# Patient Record
Sex: Female | Born: 1935 | Race: White | Hispanic: No | Marital: Married | Smoking: Never smoker
Health system: Southern US, Community
[De-identification: ages and names within clinical notes are randomized; demographics above are authoritative.]

## PROBLEM LIST (undated history)

## (undated) ENCOUNTER — Emergency Department (HOSPITAL_COMMUNITY): Payer: Self-pay | Source: Home / Self Care

## (undated) DIAGNOSIS — I959 Hypotension, unspecified: Secondary | ICD-10-CM

## (undated) DIAGNOSIS — E7800 Pure hypercholesterolemia, unspecified: Secondary | ICD-10-CM

## (undated) DIAGNOSIS — C801 Malignant (primary) neoplasm, unspecified: Secondary | ICD-10-CM

## (undated) DIAGNOSIS — I4891 Unspecified atrial fibrillation: Secondary | ICD-10-CM

## (undated) DIAGNOSIS — Z950 Presence of cardiac pacemaker: Secondary | ICD-10-CM

## (undated) HISTORY — PX: KNEE SURGERY: SHX244

## (undated) HISTORY — PX: CHOLECYSTECTOMY: SHX55

## (undated) HISTORY — PX: ANKLE SURGERY: SHX546

## (undated) HISTORY — PX: BREAST SURGERY: SHX581

---

## 2003-12-19 ENCOUNTER — Encounter: Payer: Self-pay | Admitting: Internal Medicine

## 2003-12-19 LAB — CONVERTED CEMR LAB

## 2004-05-13 ENCOUNTER — Ambulatory Visit: Admission: RE | Admit: 2004-05-13 | Discharge: 2004-07-20 | Payer: Self-pay | Admitting: Radiation Oncology

## 2004-08-23 ENCOUNTER — Ambulatory Visit: Admission: RE | Admit: 2004-08-23 | Discharge: 2004-08-23 | Payer: Self-pay

## 2004-10-21 ENCOUNTER — Ambulatory Visit: Payer: Self-pay | Admitting: Oncology

## 2005-01-18 ENCOUNTER — Ambulatory Visit: Payer: Self-pay | Admitting: Oncology

## 2005-01-27 ENCOUNTER — Ambulatory Visit: Admission: RE | Admit: 2005-01-27 | Discharge: 2005-01-27 | Payer: Self-pay | Admitting: Radiation Oncology

## 2005-04-12 ENCOUNTER — Ambulatory Visit: Payer: Self-pay | Admitting: Oncology

## 2005-07-05 ENCOUNTER — Ambulatory Visit: Payer: Self-pay | Admitting: Oncology

## 2005-09-08 ENCOUNTER — Ambulatory Visit: Payer: Self-pay | Admitting: Oncology

## 2005-12-15 ENCOUNTER — Ambulatory Visit: Payer: Self-pay | Admitting: Oncology

## 2005-12-22 ENCOUNTER — Ambulatory Visit: Payer: Self-pay | Admitting: Internal Medicine

## 2006-01-09 ENCOUNTER — Ambulatory Visit: Payer: Self-pay | Admitting: Internal Medicine

## 2006-02-09 ENCOUNTER — Ambulatory Visit: Payer: Self-pay | Admitting: Oncology

## 2006-04-13 ENCOUNTER — Ambulatory Visit: Payer: Self-pay | Admitting: Oncology

## 2006-05-03 ENCOUNTER — Ambulatory Visit: Payer: Self-pay | Admitting: Endocrinology

## 2006-06-08 ENCOUNTER — Ambulatory Visit: Payer: Self-pay | Admitting: Oncology

## 2006-08-03 ENCOUNTER — Ambulatory Visit: Payer: Self-pay | Admitting: Oncology

## 2006-11-26 ENCOUNTER — Ambulatory Visit: Payer: Self-pay | Admitting: Oncology

## 2006-12-04 ENCOUNTER — Ambulatory Visit: Payer: Self-pay | Admitting: Internal Medicine

## 2007-01-07 ENCOUNTER — Ambulatory Visit: Payer: Self-pay | Admitting: Internal Medicine

## 2007-01-16 ENCOUNTER — Ambulatory Visit: Payer: Self-pay | Admitting: Internal Medicine

## 2007-02-12 ENCOUNTER — Ambulatory Visit: Payer: Self-pay | Admitting: Internal Medicine

## 2007-03-15 ENCOUNTER — Ambulatory Visit: Payer: Self-pay | Admitting: Internal Medicine

## 2007-03-15 LAB — CONVERTED CEMR LAB
Basophils Relative: 1.7 % — ABNORMAL HIGH (ref 0.0–1.0)
Bilirubin, Direct: 0.2 mg/dL (ref 0.0–0.3)
CO2: 30 meq/L (ref 19–32)
Creatinine, Ser: 0.9 mg/dL (ref 0.4–1.2)
Eosinophils Relative: 4.7 % (ref 0.0–5.0)
GFR calc Af Amer: 78 mL/min
Glucose, Bld: 94 mg/dL (ref 70–99)
HCT: 38.7 % (ref 36.0–46.0)
HDL: 59.3 mg/dL (ref >39.0)
Hemoglobin: 13.6 g/dL (ref 12.0–15.0)
Lymphocytes Relative: 17.3 % (ref 12.0–46.0)
Monocytes Absolute: 0.4 10*3/uL (ref 0.2–0.7)
Monocytes Relative: 8.4 % (ref 3.0–11.0)
Neutro Abs: 3.2 10*3/uL (ref 1.4–7.7)
Neutrophils Relative %: 67.9 % (ref 43.0–77.0)
Potassium: 4.4 meq/L (ref 3.5–5.1)
Sodium: 143 meq/L (ref 135–145)
Total Bilirubin: 0.8 mg/dL (ref 0.3–1.2)
Total Protein: 6.7 g/dL (ref 6.0–8.3)
VLDL: 21 mg/dL (ref 0–40)
WBC: 4.7 10*3/uL (ref 4.5–10.5)

## 2007-05-29 ENCOUNTER — Ambulatory Visit: Payer: Self-pay | Admitting: Oncology

## 2007-08-06 ENCOUNTER — Encounter: Payer: Self-pay | Admitting: Internal Medicine

## 2007-08-06 DIAGNOSIS — J45909 Unspecified asthma, uncomplicated: Secondary | ICD-10-CM | POA: Insufficient documentation

## 2007-08-06 DIAGNOSIS — Z853 Personal history of malignant neoplasm of breast: Secondary | ICD-10-CM

## 2007-08-12 DIAGNOSIS — J309 Allergic rhinitis, unspecified: Secondary | ICD-10-CM | POA: Insufficient documentation

## 2007-08-12 DIAGNOSIS — Z860100 Personal history of colon polyps, unspecified: Secondary | ICD-10-CM | POA: Insufficient documentation

## 2007-08-12 DIAGNOSIS — M1990 Unspecified osteoarthritis, unspecified site: Secondary | ICD-10-CM | POA: Insufficient documentation

## 2007-08-12 DIAGNOSIS — E119 Type 2 diabetes mellitus without complications: Secondary | ICD-10-CM

## 2007-11-25 ENCOUNTER — Encounter: Payer: Self-pay | Admitting: Internal Medicine

## 2007-12-08 ENCOUNTER — Telehealth: Payer: Self-pay | Admitting: Internal Medicine

## 2007-12-27 ENCOUNTER — Encounter: Payer: Self-pay | Admitting: Internal Medicine

## 2007-12-31 ENCOUNTER — Ambulatory Visit: Payer: Self-pay | Admitting: Internal Medicine

## 2007-12-31 DIAGNOSIS — R5383 Other fatigue: Secondary | ICD-10-CM

## 2007-12-31 DIAGNOSIS — R030 Elevated blood-pressure reading, without diagnosis of hypertension: Secondary | ICD-10-CM

## 2007-12-31 DIAGNOSIS — M810 Age-related osteoporosis without current pathological fracture: Secondary | ICD-10-CM | POA: Insufficient documentation

## 2007-12-31 DIAGNOSIS — R5381 Other malaise: Secondary | ICD-10-CM

## 2007-12-31 DIAGNOSIS — E785 Hyperlipidemia, unspecified: Secondary | ICD-10-CM

## 2008-01-01 LAB — CONVERTED CEMR LAB
AST: 25 units/L (ref 0–37)
Albumin: 3.8 g/dL (ref 3.5–5.2)
Basophils Absolute: 0.1 10*3/uL (ref 0.0–0.1)
Bilirubin, Direct: 0.1 mg/dL (ref 0.0–0.3)
Chloride: 104 meq/L (ref 96–112)
Cholesterol: 135 mg/dL (ref 0–200)
Creatinine, Ser: 0.9 mg/dL (ref 0.4–1.2)
Creatinine,U: 22.4 mg/dL
Eosinophils Relative: 4.1 % (ref 0.0–5.0)
Glucose, Bld: 85 mg/dL (ref 70–99)
HCT: 38.8 % (ref 36.0–46.0)
Hemoglobin: 13.5 g/dL (ref 12.0–15.0)
LDL Cholesterol: 60 mg/dL (ref 0–99)
MCHC: 34.8 g/dL (ref 30.0–36.0)
MCV: 87 fL (ref 78.0–100.0)
Microalb, Ur: 0.6 mg/dL (ref 0.0–1.9)
Monocytes Absolute: 0.5 10*3/uL (ref 0.2–0.7)
Neutrophils Relative %: 63.4 % (ref 43.0–77.0)
Potassium: 4.3 meq/L (ref 3.5–5.1)
RBC: 4.46 M/uL (ref 3.87–5.11)
RDW: 12.9 % (ref 11.5–14.6)
Sodium: 139 meq/L (ref 135–145)
Total Bilirubin: 0.9 mg/dL (ref 0.3–1.2)
Total Protein: 7 g/dL (ref 6.0–8.3)
WBC: 4.8 10*3/uL (ref 4.5–10.5)

## 2008-01-03 ENCOUNTER — Encounter: Payer: Self-pay | Admitting: Internal Medicine

## 2008-04-21 ENCOUNTER — Ambulatory Visit: Payer: Self-pay | Admitting: Internal Medicine

## 2008-04-21 DIAGNOSIS — L989 Disorder of the skin and subcutaneous tissue, unspecified: Secondary | ICD-10-CM | POA: Insufficient documentation

## 2008-06-17 ENCOUNTER — Encounter: Payer: Self-pay | Admitting: Internal Medicine

## 2008-06-18 ENCOUNTER — Telehealth (INDEPENDENT_AMBULATORY_CARE_PROVIDER_SITE_OTHER): Payer: Self-pay | Admitting: *Deleted

## 2008-07-08 ENCOUNTER — Encounter: Payer: Self-pay | Admitting: Internal Medicine

## 2008-08-11 ENCOUNTER — Encounter: Payer: Self-pay | Admitting: Internal Medicine

## 2008-08-13 ENCOUNTER — Ambulatory Visit (HOSPITAL_BASED_OUTPATIENT_CLINIC_OR_DEPARTMENT_OTHER): Admission: RE | Admit: 2008-08-13 | Discharge: 2008-08-13 | Payer: Self-pay | Admitting: Orthopedic Surgery

## 2008-08-13 ENCOUNTER — Encounter (INDEPENDENT_AMBULATORY_CARE_PROVIDER_SITE_OTHER): Payer: Self-pay | Admitting: Orthopedic Surgery

## 2008-08-18 ENCOUNTER — Encounter: Payer: Self-pay | Admitting: Internal Medicine

## 2008-08-27 ENCOUNTER — Encounter: Payer: Self-pay | Admitting: Internal Medicine

## 2008-09-02 ENCOUNTER — Encounter: Payer: Self-pay | Admitting: Internal Medicine

## 2008-10-22 ENCOUNTER — Encounter: Payer: Self-pay | Admitting: Internal Medicine

## 2008-10-29 ENCOUNTER — Encounter: Payer: Self-pay | Admitting: Internal Medicine

## 2009-07-19 ENCOUNTER — Telehealth: Payer: Self-pay | Admitting: Internal Medicine

## 2011-05-02 NOTE — Op Note (Signed)
NAME:  [name], Susan                  ACCOUNT NO.:  [id number]   MEDICAL RECORD NO.:  [id number]          PATIENT TYPE:  AMB   LOCATION:  DSC                          FACILITY:  [facility]   PHYSICIAN:  Cindee Salt, M.D.       DATE OF BIRTH:  1929/11/08   DATE OF PROCEDURE:  DATE OF DISCHARGE:                               OPERATIVE REPORT   PREOPERATIVE DIAGNOSIS:  Mucoid cyst, left index, left middle finger.   POSTOPERATIVE DIAGNOSIS:  Mucoid cyst left index, left middle finger.   OPERATION:  Excision of cyst, debridement distal interphalangeal joint  left index, left middle finger.   SURGEON:  Cindee Salt, MD   ASSISTANT:  Carolyne Fiscal, RN   ANESTHESIA:  General.   ANESTHESIOLOGIST:  Zenon Mayo, MD   HISTORY:  The patient is a 77 year old female with a history of mucoid  cyst, degenerative arthritis DIP joints, left index, left middle finger.  She has drained the index finger on several occasions.  She is admitted  now for excision.   Pre, peri, and postoperative course have been discussed along with risks  and complications.  She is aware that there is no guarantee with the  surgery, possibility of infection, recurrence, injury to arteries,  nerves, or tendons, complete relief of symptoms, dystrophy, possibility  of stiffness of the joints, possibility of future fusion.  She has  elected to proceed to have this done.  In the preoperative area, the  patient was seen, the extremity marked by both the patient and surgeon,  questions again encouraged, and answered to her satisfaction.  Antibiotic given.   PROCEDURE:  The patient is brought to the operating room.  A general  anesthetic carried out under the direction of Dr. Sampson Goon.  She is  prepped using DuraPrep, supine position, left arm free.  Penrose drains  were used for tourniquet control at base of the finger.  The middle  finger was approached first.  The finger was exsanguinated with gauze  sponge.  Penrose drain was  used for tourniquet control.  At the base of  the finger, a curvilinear incision was made, carried down through  subcutaneous tissue.  Bleeders were electrocauterized.  The extensor  tendon was identified.  The cyst on either side identified.  These were  excised, sent to pathology.  The joint was then opened, debrided with a  Fackler rongeur including a synovectomy of a very significant synovitis.  No further lesions were identified.  Specimen was sent to Pathology.  The wound irrigated and skin closed with interrupted 5-0 Vicryl deep  sutures.  Separate incision was then made over the middle finger, distal  interphalangeal joint and carried down through subcutaneous tissue.  Bleeders again electrocauterized.  The cysts were followed under the  skin and excised both radially and ulnarly.  The joint was opened and a  synovectomy and debridement of the joint of osteophytes was then  performed using a Sitzman rongeur.  Again specimen was sent to Pathology  separately.  Tourniquet control was again done under Penrose drain  tourniquet at the base  of the finger.  The wound irrigated and closed  with interrupted  5-0 Vicryl Rapide sutures.  A sterile compressive dressing splint to  each distal interphalangeal joint was applied.  The patient tolerated  the procedure well, was taken to the recovery room for observation in  satisfactory condition.  She will be discharged home to return to the  [facility] in [place] in 1 week on Vicodin.           ______________________________  Cindee Salt, M.D.     GK/MEDQ  D:  08/13/2008  T:  08/14/2008  Job:  [id number]

## 2014-01-18 DEATH — deceased

## 2014-06-17 HISTORY — PX: PACEMAKER INSERTION: SHX728

## 2014-08-26 ENCOUNTER — Inpatient Hospital Stay (HOSPITAL_COMMUNITY): Payer: Medicare Other

## 2014-08-26 ENCOUNTER — Emergency Department (HOSPITAL_COMMUNITY): Payer: Medicare Other | Admitting: Anesthesiology

## 2014-08-26 ENCOUNTER — Emergency Department (HOSPITAL_COMMUNITY): Payer: Medicare Other

## 2014-08-26 ENCOUNTER — Encounter (HOSPITAL_COMMUNITY): Payer: Self-pay | Admitting: Emergency Medicine

## 2014-08-26 ENCOUNTER — Inpatient Hospital Stay (HOSPITAL_COMMUNITY)
Admission: EM | Admit: 2014-08-26 | Discharge: 2014-09-01 | DRG: 023 | Disposition: A | Discharge status: Home Health | Payer: Medicare Other | Source: Intra-hospital | Attending: Neurology | Admitting: Neurology

## 2014-08-26 ENCOUNTER — Encounter (HOSPITAL_COMMUNITY): Admission: EM | Disposition: A | Discharge status: Home Health | Payer: Self-pay | Attending: Neurology

## 2014-08-26 ENCOUNTER — Encounter (HOSPITAL_COMMUNITY): Payer: Medicare Other | Admitting: Anesthesiology

## 2014-08-26 DIAGNOSIS — R471 Dysarthria and anarthria: Secondary | ICD-10-CM | POA: Diagnosis present

## 2014-08-26 DIAGNOSIS — G8190 Hemiplegia, unspecified affecting unspecified side: Secondary | ICD-10-CM | POA: Diagnosis present

## 2014-08-26 DIAGNOSIS — I10 Essential (primary) hypertension: Secondary | ICD-10-CM | POA: Diagnosis present

## 2014-08-26 DIAGNOSIS — G255 Other chorea: Secondary | ICD-10-CM

## 2014-08-26 DIAGNOSIS — I4891 Unspecified atrial fibrillation: Secondary | ICD-10-CM | POA: Diagnosis present

## 2014-08-26 DIAGNOSIS — R29810 Facial weakness: Secondary | ICD-10-CM | POA: Diagnosis present

## 2014-08-26 DIAGNOSIS — E785 Hyperlipidemia, unspecified: Secondary | ICD-10-CM

## 2014-08-26 DIAGNOSIS — I6340 Cerebral infarction due to embolism of unspecified cerebral artery: Secondary | ICD-10-CM | POA: Diagnosis present

## 2014-08-26 DIAGNOSIS — E7800 Pure hypercholesterolemia, unspecified: Secondary | ICD-10-CM | POA: Diagnosis present

## 2014-08-26 DIAGNOSIS — D649 Anemia, unspecified: Secondary | ICD-10-CM | POA: Diagnosis present

## 2014-08-26 DIAGNOSIS — I639 Cerebral infarction, unspecified: Secondary | ICD-10-CM | POA: Diagnosis present

## 2014-08-26 DIAGNOSIS — Z888 Allergy status to other drugs, medicaments and biological substances status: Secondary | ICD-10-CM | POA: Diagnosis not present

## 2014-08-26 DIAGNOSIS — I5022 Chronic systolic (congestive) heart failure: Secondary | ICD-10-CM

## 2014-08-26 DIAGNOSIS — G2581 Restless legs syndrome: Secondary | ICD-10-CM

## 2014-08-26 DIAGNOSIS — I951 Orthostatic hypotension: Secondary | ICD-10-CM

## 2014-08-26 DIAGNOSIS — Z950 Presence of cardiac pacemaker: Secondary | ICD-10-CM | POA: Diagnosis not present

## 2014-08-26 DIAGNOSIS — G9340 Encephalopathy, unspecified: Secondary | ICD-10-CM | POA: Diagnosis present

## 2014-08-26 DIAGNOSIS — I509 Heart failure, unspecified: Secondary | ICD-10-CM | POA: Diagnosis present

## 2014-08-26 DIAGNOSIS — I63239 Cerebral infarction due to unspecified occlusion or stenosis of unspecified carotid arteries: Secondary | ICD-10-CM | POA: Diagnosis present

## 2014-08-26 DIAGNOSIS — G936 Cerebral edema: Secondary | ICD-10-CM | POA: Diagnosis present

## 2014-08-26 DIAGNOSIS — Z23 Encounter for immunization: Secondary | ICD-10-CM

## 2014-08-26 DIAGNOSIS — I447 Left bundle-branch block, unspecified: Secondary | ICD-10-CM | POA: Diagnosis not present

## 2014-08-26 DIAGNOSIS — J4489 Other specified chronic obstructive pulmonary disease: Secondary | ICD-10-CM

## 2014-08-26 DIAGNOSIS — J9600 Acute respiratory failure, unspecified whether with hypoxia or hypercapnia: Secondary | ICD-10-CM

## 2014-08-26 DIAGNOSIS — I5020 Unspecified systolic (congestive) heart failure: Secondary | ICD-10-CM

## 2014-08-26 DIAGNOSIS — E119 Type 2 diabetes mellitus without complications: Secondary | ICD-10-CM

## 2014-08-26 DIAGNOSIS — Z9911 Dependence on respirator [ventilator] status: Secondary | ICD-10-CM

## 2014-08-26 DIAGNOSIS — I63411 Cerebral infarction due to embolism of right middle cerebral artery: Secondary | ICD-10-CM

## 2014-08-26 DIAGNOSIS — I6350 Cerebral infarction due to unspecified occlusion or stenosis of unspecified cerebral artery: Secondary | ICD-10-CM

## 2014-08-26 DIAGNOSIS — J9601 Acute respiratory failure with hypoxia: Secondary | ICD-10-CM

## 2014-08-26 DIAGNOSIS — J449 Chronic obstructive pulmonary disease, unspecified: Secondary | ICD-10-CM

## 2014-08-26 HISTORY — PX: RADIOLOGY WITH ANESTHESIA: SHX6223

## 2014-08-26 HISTORY — DX: Pure hypercholesterolemia, unspecified: E78.00

## 2014-08-26 HISTORY — DX: Malignant (primary) neoplasm, unspecified: C80.1

## 2014-08-26 HISTORY — DX: Unspecified atrial fibrillation: I48.91

## 2014-08-26 HISTORY — DX: Presence of cardiac pacemaker: Z95.0

## 2014-08-26 HISTORY — DX: Hypotension, unspecified: I95.9

## 2014-08-26 LAB — URINE MICROSCOPIC-ADD ON

## 2014-08-26 LAB — CBC
HCT: 35.5 % — ABNORMAL LOW (ref 36.0–46.0)
Hemoglobin: 11.3 g/dL — ABNORMAL LOW (ref 12.0–15.0)
MCH: 28 pg (ref 26.0–34.0)
MCHC: 31.8 g/dL (ref 30.0–36.0)
MCV: 88.1 fL (ref 78.0–100.0)
Platelets: 207 10*3/uL (ref 150–400)
RBC: 4.03 MIL/uL (ref 3.87–5.11)
RDW: 14 % (ref 11.5–15.5)
WBC: 8.3 10*3/uL (ref 4.0–10.5)

## 2014-08-26 LAB — I-STAT TROPONIN, ED: TROPONIN I, POC: 0.01 ng/mL (ref 0.00–0.08)

## 2014-08-26 LAB — DIFFERENTIAL
BASOS PCT: 1 % (ref 0–1)
Basophils Absolute: 0 10*3/uL (ref 0.0–0.1)
Eosinophils Absolute: 0.2 10*3/uL (ref 0.0–0.7)
Eosinophils Relative: 2 % (ref 0–5)
Lymphocytes Relative: 12 % (ref 12–46)
Lymphs Abs: 1 10*3/uL (ref 0.7–4.0)
MONO ABS: 0.8 10*3/uL (ref 0.1–1.0)
Monocytes Relative: 9 % (ref 3–12)
NEUTROS PCT: 76 % (ref 43–77)
Neutro Abs: 6.4 10*3/uL (ref 1.7–7.7)

## 2014-08-26 LAB — COMPREHENSIVE METABOLIC PANEL
ALT: 133 U/L — AB (ref 0–35)
AST: 53 U/L — AB (ref 0–37)
Albumin: 3.3 g/dL — ABNORMAL LOW (ref 3.5–5.2)
Alkaline Phosphatase: 75 U/L (ref 39–117)
Anion gap: 12 (ref 5–15)
BILIRUBIN TOTAL: 0.3 mg/dL (ref 0.3–1.2)
BUN: 16 mg/dL (ref 6–23)
CO2: 23 mEq/L (ref 19–32)
CREATININE: 0.85 mg/dL (ref 0.50–1.10)
Calcium: 8.9 mg/dL (ref 8.4–10.5)
Chloride: 103 mEq/L (ref 96–112)
GFR calc Af Amer: 72 mL/min — ABNORMAL LOW (ref >90)
GFR calc non Af Amer: 62 mL/min — ABNORMAL LOW (ref >90)
Glucose, Bld: 90 mg/dL (ref 70–99)
Potassium: 4 mEq/L (ref 3.7–5.3)
Sodium: 138 mEq/L (ref 137–147)
Total Protein: 6.4 g/dL (ref 6.0–8.3)

## 2014-08-26 LAB — URINALYSIS, ROUTINE W REFLEX MICROSCOPIC
BILIRUBIN URINE: NEGATIVE
GLUCOSE, UA: NEGATIVE mg/dL
KETONES UR: NEGATIVE mg/dL
Leukocytes, UA: NEGATIVE
Nitrite: NEGATIVE
Protein, ur: 30 mg/dL — AB
Specific Gravity, Urine: 1.024 (ref 1.005–1.030)
Urobilinogen, UA: 1 mg/dL (ref 0.0–1.0)
pH: 5 (ref 5.0–8.0)

## 2014-08-26 LAB — POCT I-STAT 4, (NA,K, GLUC, HGB,HCT)
GLUCOSE: 111 mg/dL — AB (ref 70–99)
HCT: 27 % — ABNORMAL LOW (ref 36.0–46.0)
Hemoglobin: 9.2 g/dL — ABNORMAL LOW (ref 12.0–15.0)
Potassium: 3.7 mEq/L (ref 3.7–5.3)
Sodium: 140 mEq/L (ref 137–147)

## 2014-08-26 LAB — RAPID URINE DRUG SCREEN, HOSP PERFORMED
Amphetamines: NOT DETECTED
BARBITURATES: NOT DETECTED
BENZODIAZEPINES: NOT DETECTED
COCAINE: NOT DETECTED
Opiates: NOT DETECTED
Tetrahydrocannabinol: NOT DETECTED

## 2014-08-26 LAB — PROTIME-INR
INR: 1.13 (ref 0.00–1.49)
Prothrombin Time: 14.5 seconds (ref 11.6–15.2)

## 2014-08-26 LAB — TRIGLYCERIDES: Triglycerides: 100 mg/dL (ref <150)

## 2014-08-26 LAB — TROPONIN I

## 2014-08-26 LAB — APTT: aPTT: 25 seconds (ref 24–37)

## 2014-08-26 LAB — CBG MONITORING, ED: Glucose-Capillary: 81 mg/dL (ref 70–99)

## 2014-08-26 SURGERY — RADIOLOGY WITH ANESTHESIA
Anesthesia: General

## 2014-08-26 MED ORDER — CEFAZOLIN SODIUM-DEXTROSE 2-3 GM-% IV SOLR
INTRAVENOUS | Status: AC
Start: 2014-08-26 — End: 2014-08-27
  Filled 2014-08-26: qty 50

## 2014-08-26 MED ORDER — FENTANYL CITRATE 0.05 MG/ML IJ SOLN
INTRAMUSCULAR | Status: DC | PRN
  Administered 2014-08-26 (×5): 50 ug via INTRAVENOUS

## 2014-08-26 MED ORDER — FENTANYL CITRATE 0.05 MG/ML IJ SOLN
50.0000 ug | INTRAMUSCULAR | Status: DC | PRN
  Administered 2014-08-26 – 2014-08-27 (×2): 50 ug via INTRAVENOUS
  Filled 2014-08-26 (×2): qty 2

## 2014-08-26 MED ORDER — PROPOFOL 10 MG/ML IV BOLUS
INTRAVENOUS | Status: DC | PRN
  Administered 2014-08-26: 40 mg via INTRAVENOUS
  Administered 2014-08-26: 100 mg via INTRAVENOUS

## 2014-08-26 MED ORDER — SODIUM CHLORIDE 0.9 % IV SOLN
INTRAVENOUS | Status: DC | PRN
  Administered 2014-08-26 (×2): via INTRAVENOUS

## 2014-08-26 MED ORDER — PANTOPRAZOLE SODIUM 40 MG IV SOLR
40.0000 mg | Freq: Every day | INTRAVENOUS | Status: DC
  Administered 2014-08-26 – 2014-08-27 (×2): 40 mg via INTRAVENOUS
  Filled 2014-08-26 (×2): qty 40

## 2014-08-26 MED ORDER — EPTIFIBATIDE 2 MG/ML IV SOLN
INTRAVENOUS | Status: AC
  Administered 2014-08-26 (×4): 2 mg
  Administered 2014-08-26: 1.8 mg
  Filled 2014-08-26: qty 10

## 2014-08-26 MED ORDER — LABETALOL HCL 5 MG/ML IV SOLN
INTRAVENOUS | Status: AC
  Administered 2014-08-26: 20 mg
  Filled 2014-08-26: qty 4

## 2014-08-26 MED ORDER — ACETAMINOPHEN 650 MG RE SUPP: 650.0000 mg | RECTAL | Status: DC | PRN

## 2014-08-26 MED ORDER — ACETAMINOPHEN 325 MG PO TABS: 650.0000 mg | ORAL_TABLET | ORAL | Status: DC | PRN

## 2014-08-26 MED ORDER — SUCCINYLCHOLINE CHLORIDE 20 MG/ML IJ SOLN
INTRAMUSCULAR | Status: DC | PRN
  Administered 2014-08-26: 80 mg via INTRAVENOUS

## 2014-08-26 MED ORDER — STROKE: EARLY STAGES OF RECOVERY BOOK
Freq: Once | Status: AC
  Administered 2014-08-26: 20:00:00
  Filled 2014-08-26: qty 1

## 2014-08-26 MED ORDER — PROPOFOL 10 MG/ML IV EMUL
0.0000 ug/kg/min | INTRAVENOUS | Status: DC
  Administered 2014-08-26: 5 ug/kg/min via INTRAVENOUS
  Administered 2014-08-27: 15 ug/kg/min via INTRAVENOUS
  Administered 2014-08-27: 30 ug/kg/min via INTRAVENOUS
  Filled 2014-08-26: qty 200
  Filled 2014-08-26 (×2): qty 100

## 2014-08-26 MED ORDER — SODIUM CHLORIDE 0.9 % IV SOLN: INTRAVENOUS | Status: DC

## 2014-08-26 MED ORDER — SENNOSIDES-DOCUSATE SODIUM 8.6-50 MG PO TABS
1.0000 | ORAL_TABLET | Freq: Every evening | ORAL | Status: DC | PRN
Start: 2014-08-26 — End: 2014-09-01
  Filled 2014-08-26: qty 1

## 2014-08-26 MED ORDER — NITROGLYCERIN 1 MG/10 ML FOR IR/CATH LAB
INTRA_ARTERIAL | Status: AC
  Administered 2014-08-26 (×2): 25 ug
  Filled 2014-08-26: qty 10

## 2014-08-26 MED ORDER — PANTOPRAZOLE SODIUM 40 MG IV SOLR: 40.0000 mg | Freq: Every day | INTRAVENOUS | Status: DC

## 2014-08-26 MED ORDER — SODIUM CHLORIDE 0.9 % IV SOLN
INTRAVENOUS | Status: DC
  Administered 2014-08-26 – 2014-08-27 (×2): via INTRAVENOUS

## 2014-08-26 MED ORDER — MIDAZOLAM HCL 5 MG/5ML IJ SOLN
INTRAMUSCULAR | Status: DC | PRN
  Administered 2014-08-26: 2 mg via INTRAVENOUS

## 2014-08-26 MED ORDER — ACETAMINOPHEN 500 MG PO TABS: 1000.0000 mg | ORAL_TABLET | Freq: Four times a day (QID) | ORAL | Status: DC | PRN

## 2014-08-26 MED ORDER — ONDANSETRON HCL 4 MG/2ML IJ SOLN: 4.0000 mg | Freq: Four times a day (QID) | INTRAMUSCULAR | Status: DC | PRN

## 2014-08-26 MED ORDER — LABETALOL HCL 5 MG/ML IV SOLN: 10.0000 mg | INTRAVENOUS | Status: DC | PRN

## 2014-08-26 MED ORDER — LIDOCAINE HCL 1 % IJ SOLN
INTRAMUSCULAR | Status: AC
  Filled 2014-08-26: qty 20

## 2014-08-26 MED ORDER — PHENYLEPHRINE HCL 10 MG/ML IJ SOLN
10.0000 mg | INTRAMUSCULAR | Status: DC | PRN
  Administered 2014-08-26: 10 ug/min via INTRAVENOUS

## 2014-08-26 MED ORDER — ACETAMINOPHEN 650 MG RE SUPP: 650.0000 mg | Freq: Four times a day (QID) | RECTAL | Status: DC | PRN

## 2014-08-26 MED ORDER — NICARDIPINE HCL IN NACL 20-0.86 MG/200ML-% IV SOLN
5.0000 mg/h | INTRAVENOUS | Status: DC
Start: 2014-08-26 — End: 2014-08-27
  Administered 2014-08-26: 5 mg/h via INTRAVENOUS
  Filled 2014-08-26 (×2): qty 200

## 2014-08-26 MED ORDER — CETYLPYRIDINIUM CHLORIDE 0.05 % MT LIQD
7.0000 mL | Freq: Four times a day (QID) | OROMUCOSAL | Status: DC
  Administered 2014-08-27 (×3): 7 mL via OROMUCOSAL

## 2014-08-26 MED ORDER — CHLORHEXIDINE GLUCONATE 0.12 % MT SOLN
15.0000 mL | Freq: Two times a day (BID) | OROMUCOSAL | Status: DC
  Administered 2014-08-26 – 2014-08-27 (×2): 15 mL via OROMUCOSAL
  Filled 2014-08-26 (×2): qty 15

## 2014-08-26 MED ORDER — HEPARIN SODIUM (PORCINE) 1000 UNIT/ML IJ SOLN
INTRAMUSCULAR | Status: DC | PRN
  Administered 2014-08-26: 3000 [IU] via INTRAVENOUS

## 2014-08-26 MED ORDER — ROCURONIUM BROMIDE 100 MG/10ML IV SOLN
INTRAVENOUS | Status: DC | PRN
  Administered 2014-08-26 (×2): 10 mg via INTRAVENOUS
  Administered 2014-08-26: 20 mg via INTRAVENOUS
  Administered 2014-08-26: 50 mg via INTRAVENOUS

## 2014-08-26 MED ORDER — LIDOCAINE HCL (CARDIAC) 20 MG/ML IV SOLN
INTRAVENOUS | Status: DC | PRN
  Administered 2014-08-26: 60 mg via INTRAVENOUS

## 2014-08-26 MED ORDER — CEFAZOLIN SODIUM-DEXTROSE 2-3 GM-% IV SOLR
INTRAVENOUS | Status: DC | PRN
  Administered 2014-08-26: 2 g via INTRAVENOUS

## 2014-08-26 MED ORDER — IOHEXOL 300 MG/ML  SOLN
150.0000 mL | Freq: Once | INTRAMUSCULAR | Status: AC | PRN
  Administered 2014-08-26: 130 mL via INTRA_ARTERIAL

## 2014-08-26 NOTE — ED Notes (Signed)
Per EMS - pt woke up normal with no complaints, ate breakfast with her husband at 0930 am then later he left, called her on the phone around 1145 and the pt was acting normal on the phone then when he arrived at home around 1245 and noticed the pt had left sided paralysis, left sided facial droop and slurred speech. Upon ems arrival pt still had the same, was also drooling. En route pt's left sided weakness improved and able to move some. Hx of low BP/ hypercholesteremia and a.fib. Takes daily ASA and crestor, denies use of blood thinner. Pt has also been having some blurred vision since the weekend after she started taking amoxicillin, albuterol and prednisone. BP 136/80, HR 98 irregular, CBG 95, 100 % on 2 liters/min O2, RR 16. Pt denies falling/trauma. Nad, skin warm and dry, resp e/u.

## 2014-08-26 NOTE — H&P (Signed)
Referring Physician: ED    Chief Complaint: code stroke, left hemiparesis, left face weakness, and dysarthria.   HPI:                                                                                                                                         Susan [name] is an 80 y.o. female with a past medical history significant for HTN, hypercholesterolemia, atrial fibrillation off anticoagulants, s/p pacemaker placement, brought in via EMS as a code stroke due to acute onset of the above stated symptoms. As per EMS, she was last known normal at 930 am today. Husband said that he saw her normal at 930 am, spoke to her on the phone at 1145 am " she sound it normal on the phone, but when I  came back home she was weak in the left side, left face, and couldn't talk normal".  Patient denies HA, vertigo, double vision, visual disturbances, CP, SOB, or palpitations. Initial NIHSS 10. CT brain showed a hyperdense right MCA but no areas of hemorrhage or ischemia. Date last known well: 08/26/14  Time last known well: 9: 30 am tPA Given: no, out of the window for IV tpa but patient taken to endovascular intervention NIHSS: 10   Past Medical History  Diagnosis Date  . Hypercholesteremia   . A-fib   . Pacemaker   . Low blood pressure   . Cancer     breast    Past Surgical History  Procedure Laterality Date  . Knee surgery Left   . Ankle surgery Right   . Pacemaker insertion  06/2014  . Breast surgery    . Cholecystectomy      No family history on file. Social History:  reports that she has never smoked. She does not have any smokeless tobacco history on file. She reports that she drinks alcohol. She reports that she does not use illicit drugs.  Allergies:  Allergies  Allergen Reactions  . Atorvastatin   . Lovastatin   . Pravastatin Sodium   . Simvastatin     Medications:                                                                                                                            Scheduled: . lidocaine        ROS:  History obtained from the patient, husband, and chart review.  General ROS: negative for - chills, fatigue, fever, night sweats, or weight loss Psychological ROS: negative for - behavioral disorder, hallucinations, memory difficulties, mood swings or suicidal ideation Ophthalmic ROS: negative for - blurry vision, double vision, eye pain or loss of vision ENT ROS: negative for - epistaxis, nasal discharge, oral lesions, sore throat, tinnitus or vertigo Allergy and Immunology ROS: negative for - hives or itchy/watery eyes Hematological and Lymphatic ROS: negative for - bleeding problems, bruising or swollen lymph nodes Endocrine ROS: negative for - galactorrhea, hair pattern changes, polydipsia/polyuria or temperature intolerance Respiratory ROS: negative for - cough, hemoptysis, shortness of breath or wheezing Cardiovascular ROS: negative for - chest pain, dyspnea on exertion, edema or irregular heartbeat Gastrointestinal ROS: negative for - abdominal pain, diarrhea, hematemesis, nausea/vomiting or stool incontinence Genito-Urinary ROS: negative for - dysuria, hematuria, incontinence or urinary frequency/urgency Musculoskeletal ROS: negative for - joint swelling Neurological ROS: as noted in HPI Dermatological ROS: negative for rash and skin lesion changes  Physical exam: pleasant female in no apparent distress. Blood pressure 148/87, pulse 71, temperature 97.7 F (36.5 C), temperature source Oral, resp. rate 27, height 5\' 5"  (1.651 m), weight 88.587 kg (195 lb 4.8 oz), SpO2 98.00%. Head: normocephalic. Neck: supple, no bruits, no JVD. Cardiac: no murmurs. Lungs: clear. Abdomen: soft, no tender, no mass. Extremities: no edema. Neurologic Examination:                                                                                                       General: Mental Status: Alert, oriented, thought content appropriate.  Speech dysarthric without evidence of aphasia.  Able to follow 3 step commands without difficulty. Cranial Nerves: II: Discs flat bilaterally; Visual fields grossly normal, pupils equal, round, reactive to light and accommodation III,IV, VI: ptosis not present, extra-ocular motions intact bilaterally V,VII: smile asymmetric due to left face weakness, facial light touch sensation normal bilaterally VIII: hearing normal bilaterally IX,X: gag reflex present XI: bilateral shoulder shrug XII: midline tongue extension without atrophy or fasciculations Motor: Left hemiparesis Tone and bulk:normal tone throughout; no atrophy noted Sensory: Pinprick and light touch diminished in the left Deep Tendon Reflexes:  1+ all over Plantars: Right: downgoing   Left: upgoing Cerebellar: normal finger-to-nose and normal heel-to-shin test in the right. Can not perform in the left due to weakness. Gait:  No tested    Results for orders placed during the hospital encounter of 08/26/14 (from the past 48 hour(s))  PROTIME-INR     Status: None   Collection Time    08/26/14  2:23 PM      Result Value Ref Range   Prothrombin Time 14.5  11.6 - 15.2 seconds   INR 1.13  0.00 - 1.49  APTT     Status: None   Collection Time    08/26/14  2:23 PM      Result Value Ref Range   aPTT 25  24 - 37 seconds  CBC     Status: Abnormal   Collection Time  08/26/14  2:23 PM      Result Value Ref Range   WBC 8.3  4.0 - 10.5 K/uL   RBC 4.03  3.87 - 5.11 MIL/uL   Hemoglobin 11.3 (*) 12.0 - 15.0 g/dL   HCT 35.5 (*) 36.0 - 46.0 %   MCV 88.1  78.0 - 100.0 fL   MCH 28.0  26.0 - 34.0 pg   MCHC 31.8  30.0 - 36.0 g/dL   RDW 14.0  11.5 - 15.5 %   Platelets 207  150 - 400 K/uL  DIFFERENTIAL     Status: None   Collection Time    08/26/14  2:23 PM      Result Value Ref Range   Neutrophils Relative % 76  43 - 77 %    Neutro Abs 6.4  1.7 - 7.7 K/uL   Lymphocytes Relative 12  12 - 46 %   Lymphs Abs 1.0  0.7 - 4.0 K/uL   Monocytes Relative 9  3 - 12 %   Monocytes Absolute 0.8  0.1 - 1.0 K/uL   Eosinophils Relative 2  0 - 5 %   Eosinophils Absolute 0.2  0.0 - 0.7 K/uL   Basophils Relative 1  0 - 1 %   Basophils Absolute 0.0  0.0 - 0.1 K/uL   No results found.    Assessment: 80 y.o. female with right MCA syndrome in the context of acute cerebral infarct, most likely cardio-embolic. NIHSS 10. CT brain with hyperdense right MCA sign. Out of the window for IV tpa, thus will pursue endovascular intervention. Husband in agreement to follow that pathway. Will admit to NICU.  Stroke team to follow up in the morning.  Stroke Risk Factors - age, HTN, hypercholesterolemia, atrial fibrillation.  Plan: 1. HgbA1c, fasting lipid panel 2. MRI of the brain without contrast 3. Echocardiogram 4. Carotid dopplers 5. Prophylactic therapy-aspirin as per protocol 6. Risk factor modification 7. Telemetry monitoring 8. Frequent neuro checks 9. PT/OT SLP  Dorian Pod ,MD [facility] [phone]  08/26/2014, 2:52 PM

## 2014-08-26 NOTE — Anesthesia Procedure Notes (Signed)
Procedure Name: Intubation Date/Time: 08/26/2014 3:37 PM Performed by: Maude Leriche D Pre-anesthesia Checklist: Patient identified, Emergency Drugs available, Suction available, Patient being monitored and Timeout performed Patient Re-evaluated:Patient Re-evaluated prior to inductionOxygen Delivery Method: Circle system utilized Preoxygenation: Pre-oxygenation with 100% oxygen Intubation Type: IV induction, Rapid sequence and Cricoid Pressure applied Laryngoscope Size: Miller and 2 Grade View: Grade II Tube type: Subglottic suction tube Tube size: 7.5 mm Number of attempts: 1 Airway Equipment and Method: Stylet (blue stylet used) Placement Confirmation: ETT inserted through vocal cords under direct vision,  positive ETCO2 and breath sounds checked- equal and bilateral Secured at: 21 cm Tube secured with: Tape Dental Injury: Teeth and Oropharynx as per pre-operative assessment

## 2014-08-26 NOTE — Procedures (Signed)
S/P bilateral common  Carotid arteriograms followed complete revascularization of T occlusion of RT ICA terminus ,RT MCA and RT ACA using 10 mg of superselective IA integrelin and x 1 pass with the Solitaire FR device. Persistent occlusion of of Prox RT ICA despite use of solitaire Fr x2 and the penumbra aspiration system./ Rt MCA and RT ACA  And RT ICA terminus fill via ACOM from LT ICA promptly,.

## 2014-08-26 NOTE — Consult Note (Signed)
PULMONARY / CRITICAL CARE MEDICINE   Name: Susan [name] MRN: [id number] DOB: 04/16/1929    ADMISSION DATE:  08/26/2014 CONSULTATION DATE:  08/26/2014  REFERRING MD :  Armida Sans  CHIEF COMPLAINT:  CVA s/p IR revascularization  INITIAL PRESENTATION: 80 y.o. F brought to ED on 9/9 with left hemiparesis, left face weakness, dysarthria.  In ED, CT revealed hyperdense right MCA, no hemorrhage.  Pt was taken to IR and had revascularization for total occlusion of right ICA terminus, right MCA and right ACA.  She returned to the ICU on the vent and PCCM was consulted.  STUDIES:  CT Head 9/9 >>> new dense right MCA concerning for acute thrombus, very early edema questioned, no hemorrhage.  SIGNIFICANT EVENTS: 9/9 - brought to ED as code stroke, went to IR for revascularization, returned to ICU on vent.   HISTORY OF PRESENT ILLNESS:  Pt is encephalopathic; therefore, this HPI is obtained from chart review. Mrs. Susan [name] is a 80 y.o. F with PMH as outlined below.  She was brought to the [facility] ED via EMS on 9/9 as a code stroke.  Husband left home around 930 that morning and pt was in her USOH.  He then spoke to her shortly before noon and did not notice any changes.  When he returned home around 1245, she was slumped over, had left sided weakness, and "couldn't talk normally".   In ED, CT of the head revealed  PAST MEDICAL HISTORY :  Past Medical History  Diagnosis Date  . Hypercholesteremia   . A-fib   . Pacemaker   . Low blood pressure   . Cancer     breast   Past Surgical History  Procedure Laterality Date  . Knee surgery Left   . Ankle surgery Right   . Pacemaker insertion  06/2014  . Breast surgery    . Cholecystectomy     Prior to Admission medications   Not on File   Allergies  Allergen Reactions  . Atorvastatin   . Lovastatin   . Pravastatin Sodium   . Simvastatin     FAMILY HISTORY:  No family history on file. SOCIAL HISTORY:  reports that she has never smoked. She does not  have any smokeless tobacco history on file. She reports that she drinks alcohol. She reports that she does not use illicit drugs.  REVIEW OF SYSTEMS:  Unable to obtain as pt is encephalopathic.  SUBJECTIVE:   VITAL SIGNS: Temp:  [92.9 F (33.8 C)-97.7 F (36.5 C)] 92.9 F (33.8 C) (09/09 1905) Pulse Rate:  [70-78] 70 (09/09 1956) Resp:  [16-27] 16 (09/09 1956) BP: (129-163)/(71-87) 143/71 mmHg (09/09 1956) SpO2:  [98 %-100 %] 99 % (09/09 1956) FiO2 (%):  [90 %-100 %] 90 % (09/09 1956) Weight:  [86.2 kg (190 lb 0.6 oz)-88.587 kg (195 lb 4.8 oz)] 86.2 kg (190 lb 0.6 oz) (09/09 1905) HEMODYNAMICS:   VENTILATOR SETTINGS: Vent Mode:  [-] PRVC FiO2 (%):  [90 %-100 %] 90 % Set Rate:  [16 bmp] 16 bmp Vt Set:  [440 mL-550 mL] 440 mL PEEP:  [5 cmH20] 5 cmH20 Plateau Pressure:  [19 cmH20-23 cmH20] 19 cmH20 INTAKE / OUTPUT: Intake/Output     09/09 0701 - 09/10 0700   I.V. (mL/kg) 1300 (15.1)   Total Intake(mL/kg) 1300 (15.1)   Urine (mL/kg/hr) 1100   Blood 200   Total Output 1300   Net 0         PHYSICAL EXAMINATION: General: Elderly female, in NAD.  Neuro: Sedated. HEENT: [place]/AT. PERRL, sclerae anicteric. Cardiovascular: RRR, no M/R/G.  Lungs: Respirations even and unlabored.  CTA bilaterally, No W/R/R.  Abdomen: BS x 4, soft, NT/ND.  Musculoskeletal: No gross deformities, no edema.  Skin: Intact, warm, no rashes.  LABS:  CBC  Recent Labs Lab 08/26/14 1423 08/26/14 1659  WBC 8.3  --   HGB 11.3* 9.2*  HCT 35.5* 27.0*  PLT 207  --    Coag's  Recent Labs Lab 08/26/14 1423  APTT 25  INR 1.13   BMET  Recent Labs Lab 08/26/14 1423 08/26/14 1659  NA 138 140  K 4.0 3.7  CL 103  --   CO2 23  --   BUN 16  --   CREATININE 0.85  --   GLUCOSE 90 111*   Electrolytes  Recent Labs Lab 08/26/14 1423  CALCIUM 8.9   Sepsis Markers No results found for this basename: LATICACIDVEN, PROCALCITON, O2SATVEN,  in the last 168 hours ABG No results found for this  basename: PHART, PCO2ART, PO2ART,  in the last 168 hours Liver Enzymes  Recent Labs Lab 08/26/14 1423  AST 53*  ALT 133*  ALKPHOS 75  BILITOT 0.3  ALBUMIN 3.3*   Cardiac Enzymes No results found for this basename: TROPONINI, PROBNP,  in the last 168 hours Glucose  Recent Labs Lab 08/26/14 1436  GLUCAP 81    Imaging Ct Head Wo Contrast  08/26/2014   CLINICAL DATA:  Post thrombectomy, RIGHT middle cerebral artery occlusion, followup  EXAM: CT HEAD WITHOUT CONTRAST  TECHNIQUE: Contiguous axial images were obtained from the base of the skull through the vertex without intravenous contrast.  COMPARISON:  Earlier exam of 08/26/2014  FINDINGS: Mild atrophy.  Stable ventricular morphology.  No midline shift.  Foci of high attenuation are identified along the course of the RIGHT middle cerebral artery within the RIGHT sylvian fissure.  This likely represents residual contrast within the thrombus of the RIGHT middle cerebral artery.  Developing RIGHT insular and suspect RIGHT basal ganglia infarcts.  No definite intracranial hemorrhage.  RIGHT MCA at skullbase is less dense.  LEFT hemisphere is unchanged.  IMPRESSION: Foci of high attenuation along the course of the RIGHT middle cerebral artery within the RIGHT sylvian fissure likely representing residual contrast within thrombus of the RIGHT MCA post angiography.  Developing RIGHT insular and suspect basal ganglia infarcts.  No definite intracranial hemorrhage.   Electronically Signed   By: Lavonia Dana M.D.   On: 08/26/2014 19:09   Ct Head (brain) Wo Contrast  08/26/2014   CLINICAL DATA:  Code stroke.  Left-sided weakness.  EXAM: CT HEAD WITHOUT CONTRAST  TECHNIQUE: Contiguous axial images were obtained from the base of the skull through the vertex without intravenous contrast.  COMPARISON:  06/09/2012  FINDINGS: There is new, asymmetric hyperattenuation of the proximal right MCA. There is question of very subtle, early hypoattenuation in the right  MCA territory in the right temporoparietal region. There is no acute intracranial hemorrhage, mass, midline shift, or extra-axial fluid collection. Ventricles and sulci are within normal limits for age. Patchy hypodensities in the deep cerebral white matter bilaterally are nonspecific but compatible with minimal chronic [place] vessel ischemic disease, similar to the prior study.  Prior bilateral cataract extraction is noted. Mastoid air cells are clear. Mihelich right maxillary sinus mucous retention cyst is partially visualized. Mild degenerative changes are noted at the temporomandibular joints. Mild bilateral carotid siphon and intracranial vertebral artery calcification is noted.  IMPRESSION: 1. New  dense right MCA, concerning for acute thrombus. Very early edema is questioned in the right temporoparietal region. 2. No acute intracranial hemorrhage. Critical Value/emergent results were called by telephone at the time of interpretation on 08/26/2014 at 2:55 p.m. to Dr. Aram Beecham, who verbally acknowledged these results.   Electronically Signed   By: Logan Bores   On: 08/26/2014 15:01    ASSESSMENT / PLAN:  NEUROLOGIC A:   Acute right MCA infarct s/p IR revascularization P:   RASS goal: 0 to -1. Sedation:  Propofol gtt / Fentanyl PRN. Daily WUA. MRI / MRA.  PULMONARY OETT 9/9 >>> A: VDRF in the setting of acute right MCA infarct P:   Full mechanical support, wean as able. VAP bundle. SBT in AM. ABG and CXR in AM.  CARDIOVASCULAR A:  Hx hypercholesterolemia Hx A-fib - now in NSR LBB on EKG 9/9 - ? Old, no prior EKG's on record P:  Check troponin. TTE.  RENAL A:   No acute issues P:   NS @ 75. BMP in AM.  GASTROINTESTINAL A:   Nutrition P:   NPO. SLP eval in AM if extubated. SUP:  Pantoprzole.  HEMATOLOGIC A:   VTE Prophylaxis P:  SCD's / SCD's. CBC in AM.  INFECTIOUS A:   No evidence of infection P:   Monitor fever curve / WBC's.  ENDOCRINE A:   No known  issues P:   Monitor glucose on BMP.   TODAY'S SUMMARY: 80 y.o. F who suffered acute right MCA infarct. Underwent revascularization by IR, returned to ICU on vent.  LBBB on EKG, unsure if new or not.  Repeating troponin now.   Montey Hora, [facility] Pgr: [phone]  or [phone] 08/26/2014, 8:55 PM  Reviewed above, examined.  80 yo female presented with Lt sided weakness and difficulty with speech from CVA.  She underwent neuro IR procedure, and was intubated for procedure.  She has LBBB on ECG ?if old >> no prior ECG's.  She has reported hx of A fib, not on anticoagulation (not certain why).  F/u cardiac enzymes and Echo.  Continue vent support until neuro status stable.  CC time 40 minutes.  Chesley Mires, MD [facility] Pulmonary/Critical Care 08/26/2014, 11:11 PM Pager:  [phone] After 3pm call: [phone]

## 2014-08-26 NOTE — Anesthesia Preprocedure Evaluation (Addendum)
Anesthesia Evaluation  Patient identified by MRN, date of birth, ID bandGeneral Assessment Comment:Answers questions prior to induction with slurred speech  Reviewed: Unable to perform ROS - Chart review only  Airway Mallampati: III TM Distance: >3 FB Neck ROM: Full    Dental  (+) Teeth Intact   Pulmonary asthma ,          Cardiovascular + pacemaker     Neuro/Psych CVA, Residual Symptoms    GI/Hepatic   Endo/Other  diabetes  Renal/GU negative Renal ROS     Musculoskeletal  (+) Arthritis -, Osteoarthritis,    Abdominal   Peds  Hematology  (+) anemia ,   Anesthesia Other Findings   Reproductive/Obstetrics                         Anesthesia Physical Anesthesia Plan  ASA: III and emergent  Anesthesia Plan: General   Post-op Pain Management:    Induction: Intravenous, Rapid sequence and Cricoid pressure planned  Airway Management Planned: Oral ETT  Additional Equipment: Arterial line  Intra-op Plan:   Post-operative Plan: Post-operative intubation/ventilation  Informed Consent: I have reviewed the patients History and Physical, chart, labs and discussed the procedure including the risks, benefits and alternatives for the proposed anesthesia with the patient or authorized representative who has indicated his/her understanding and acceptance.   Dental advisory given  Plan Discussed with: CRNA, Surgeon and Anesthesiologist  Anesthesia Plan Comments:        Anesthesia Quick Evaluation

## 2014-08-26 NOTE — ED Notes (Signed)
CBG 81 RN notified

## 2014-08-26 NOTE — ED Notes (Signed)
IR is going to come get pt as soon as they are ready for her.

## 2014-08-26 NOTE — Progress Notes (Signed)
Recanulization per Dr D

## 2014-08-26 NOTE — Anesthesia Postprocedure Evaluation (Signed)
  Anesthesia Post-op Note  Patient: Susan [name]  Procedure(s) Performed: Procedure(s): RADIOLOGY WITH ANESTHESIA (N/A)  Patient Location: NICU  Anesthesia Type:General  Level of Consciousness: sedated and Patient remains intubated per anesthesia plan  Airway and Oxygen Therapy: Patient remains intubated per anesthesia plan and Patient placed on Ventilator (see vital sign flow sheet for setting)  Post-op Pain: none  Post-op Assessment: Post-op Vital signs reviewed, Patient's Cardiovascular Status Stable, Respiratory Function Stable, Patent Airway and No signs of Nausea or vomiting  Post-op Vital Signs: stable  Last Vitals:  Filed Vitals:   08/26/14 1905  BP: 163/85  Pulse: 75  Temp: 33.8 C  Resp: 16    Complications: No apparent anesthesia complications

## 2014-08-26 NOTE — Progress Notes (Signed)
Requested bed per teletracking to from 3M12 to IR2

## 2014-08-26 NOTE — Progress Notes (Signed)
Dr. Estanislado Pandy performed first puncture to rt groin.

## 2014-08-26 NOTE — ED Notes (Signed)
Radiology RNs at bedside to transport pt to IR.

## 2014-08-26 NOTE — Code Documentation (Signed)
80yo female arriving to [facility] via [place] at [phone].  EMS reports that the patient was last seen by her husband this morning at 0930.  She was at her baseline at that time eating breakfast.  He left the house and called to check on her at 1130 and did not notice any problems over the phone.  The patient cannot state when the symptoms occurred.  Husband returned home around 1245 to find his wife slumped over.  EMS called, EMS assessed left sided paralysis facial droop, and activated Code Stroke.  Patient began to move left side en route.  Patient taken to CT on arrival.  NIHSS 11, see documentation for details and code stroke times.  Patient with left arm and leg drift, lack of sensation in the left face and arm, left facial droop and dysarthria.  Dr. Armida Sans at bedside. Patient is outside the window to treat with tPA with LKW 0930.  CT showing dense R MCA per MD.  Dr. Armida Sans notified IR, and plan for patient to go to radiology for catheter angiogram. PIV placed x2, foley catheter placed.  Patient transported to IR by IR RNs at 1455.  Patient's husband escorted to Radiology department waiting room.  Bedside handoff with IR RN.

## 2014-08-26 NOTE — Progress Notes (Signed)
Escorted with CRNA and this RN to CT met with RT and head CT done, escorted then to 3M12 via bed with RT, CRNA, RN and tech , tol transfer to unit well, bilateral groin dressings d/i with no hematoma noted. Pulses +2 to bilateral pedal

## 2014-08-26 NOTE — Transfer of Care (Signed)
Immediate Anesthesia Transfer of Care Note  Patient: Susan [name]  Procedure(s) Performed: Procedure(s): RADIOLOGY WITH ANESTHESIA (N/A)  Patient Location: NICU  Anesthesia Type:General  Level of Consciousness: sedated  Airway & Oxygen Therapy: Patient remains intubated per anesthesia plan and Patient placed on Ventilator (see vital sign flow sheet for setting)  Post-op Assessment: Report given to PACU RN and Post -op Vital signs reviewed and stable  Post vital signs: Reviewed and stable  Complications: No apparent anesthesia complications

## 2014-08-26 NOTE — ED Provider Notes (Signed)
CSN: [id number]     Arrival date & time 08/26/14  1403 History   First MD Initiated Contact with Patient 08/26/14 1439     Chief Complaint  Patient presents with  . Code Stroke    An emergency department physician performed an initial assessment on this suspected stroke patient at 58. (Consider location/radiation/quality/duration/timing/severity/associated sxs/prior Treatment) The history is provided by the patient and the EMS personnel.   patient brought in by him as his code stroke. Last seen normal at 9:30 when her husband had breakfast with her. Patient was identified at 1245 to have left-sided the facial droop slurred speech and left-sided weakness. This was a new finding.   patient has a history of a pacemaker. Past Medical History  Diagnosis Date  . Hypercholesteremia   . A-fib   . Pacemaker   . Low blood pressure   . Cancer     breast   Past Surgical History  Procedure Laterality Date  . Knee surgery Left   . Ankle surgery Right   . Pacemaker insertion  06/2014  . Breast surgery    . Cholecystectomy     No family history on file. History  Substance Use Topics  . Smoking status: Never Smoker   . Smokeless tobacco: Not on file  . Alcohol Use: Yes     Comment: with dinner sometimes   OB History   Grav Para Term Preterm Abortions TAB SAB Ect Mult Living                 Review of Systems  Unable to perform ROS Neurological: Positive for facial asymmetry and weakness. Negative for headaches.   level V caveat applies due to the patient's stroke symptoms.    Allergies  Atorvastatin; Lovastatin; Pravastatin sodium; and Simvastatin  Home Medications   Prior to Admission medications   Not on File   BP 148/87  Pulse 71  Temp(Src) 97.7 F (36.5 C) (Oral)  Resp 27  Ht 5\' 5"  (1.651 m)  Wt 195 lb 4.8 oz (88.587 kg)  BMI 32.50 kg/m2  SpO2 98% Physical Exam  Nursing note and vitals reviewed. Constitutional: She appears well-developed and well-nourished. She  appears distressed.  HENT:  Head: Normocephalic and atraumatic.  Eyes: Conjunctivae and EOM are normal. Pupils are equal, round, and reactive to light.  Neck: Normal range of motion.  Pulmonary/Chest: Effort normal and breath sounds normal. No respiratory distress.  Abdominal: Soft. Bowel sounds are normal. There is no tenderness.  Neurological: She is alert.  Left facial droop. Left-sided the weakness arm greater than leg. Speech appears to be grossly intact.    ED Course  Procedures (including critical care time) Labs Review Labs Reviewed  CBC - Abnormal; Notable for the following:    Hemoglobin 11.3 (*)    HCT 35.5 (*)    All other components within normal limits  COMPREHENSIVE METABOLIC PANEL - Abnormal; Notable for the following:    Albumin 3.3 (*)    AST 53 (*)    ALT 133 (*)    GFR calc non Af Amer 62 (*)    GFR calc Af Amer 72 (*)    All other components within normal limits  PROTIME-INR  APTT  DIFFERENTIAL  URINE RAPID DRUG SCREEN (HOSP PERFORMED)  URINALYSIS, ROUTINE W REFLEX MICROSCOPIC  CBG MONITORING, ED  I-STAT TROPOININ, ED   Results for orders placed during the hospital encounter of 08/26/14  [facility]      Result Value Ref Range  Prothrombin Time 14.5  11.6 - 15.2 seconds   INR 1.13  0.00 - 1.49  APTT      Result Value Ref Range   aPTT 25  24 - 37 seconds  CBC      Result Value Ref Range   WBC 8.3  4.0 - 10.5 K/uL   RBC 4.03  3.87 - 5.11 MIL/uL   Hemoglobin 11.3 (*) 12.0 - 15.0 g/dL   HCT 35.5 (*) 36.0 - 46.0 %   MCV 88.1  78.0 - 100.0 fL   MCH 28.0  26.0 - 34.0 pg   MCHC 31.8  30.0 - 36.0 g/dL   RDW 14.0  11.5 - 15.5 %   Platelets 207  150 - 400 K/uL  DIFFERENTIAL      Result Value Ref Range   Neutrophils Relative % 76  43 - 77 %   Neutro Abs 6.4  1.7 - 7.7 K/uL   Lymphocytes Relative 12  12 - 46 %   Lymphs Abs 1.0  0.7 - 4.0 K/uL   Monocytes Relative 9  3 - 12 %   Monocytes Absolute 0.8  0.1 - 1.0 K/uL   Eosinophils Relative 2  0 - 5  %   Eosinophils Absolute 0.2  0.0 - 0.7 K/uL   Basophils Relative 1  0 - 1 %   Basophils Absolute 0.0  0.0 - 0.1 K/uL  COMPREHENSIVE METABOLIC PANEL      Result Value Ref Range   Sodium 138  137 - 147 mEq/L   Potassium 4.0  3.7 - 5.3 mEq/L   Chloride 103  96 - 112 mEq/L   CO2 23  19 - 32 mEq/L   Glucose, Bld 90  70 - 99 mg/dL   BUN 16  6 - 23 mg/dL   Creatinine, Ser 0.85  0.50 - 1.10 mg/dL   Calcium 8.9  8.4 - 10.5 mg/dL   Total Protein 6.4  6.0 - 8.3 g/dL   Albumin 3.3 (*) 3.5 - 5.2 g/dL   AST 53 (*) 0 - 37 U/L   ALT 133 (*) 0 - 35 U/L   Alkaline Phosphatase 75  39 - 117 U/L   Total Bilirubin 0.3  0.3 - 1.2 mg/dL   GFR calc non Af Amer 62 (*) >90 mL/min   GFR calc Af Amer 72 (*) >90 mL/min   Anion gap 12  5 - 15  CBG MONITORING, ED      Result Value Ref Range   Glucose-Capillary 81  70 - 99 mg/dL  I-STAT TROPOININ, ED      Result Value Ref Range   Troponin i, poc 0.01  0.00 - 0.08 ng/mL   Comment 3            Results for orders placed during the hospital encounter of 08/26/14  PROTIME-INR      Result Value Ref Range   Prothrombin Time 14.5  11.6 - 15.2 seconds   INR 1.13  0.00 - 1.49  APTT      Result Value Ref Range   aPTT 25  24 - 37 seconds  CBC      Result Value Ref Range   WBC 8.3  4.0 - 10.5 K/uL   RBC 4.03  3.87 - 5.11 MIL/uL   Hemoglobin 11.3 (*) 12.0 - 15.0 g/dL   HCT 35.5 (*) 36.0 - 46.0 %   MCV 88.1  78.0 - 100.0 fL   MCH 28.0  26.0 - 34.0 pg  MCHC 31.8  30.0 - 36.0 g/dL   RDW 14.0  11.5 - 15.5 %   Platelets 207  150 - 400 K/uL  DIFFERENTIAL      Result Value Ref Range   Neutrophils Relative % 76  43 - 77 %   Neutro Abs 6.4  1.7 - 7.7 K/uL   Lymphocytes Relative 12  12 - 46 %   Lymphs Abs 1.0  0.7 - 4.0 K/uL   Monocytes Relative 9  3 - 12 %   Monocytes Absolute 0.8  0.1 - 1.0 K/uL   Eosinophils Relative 2  0 - 5 %   Eosinophils Absolute 0.2  0.0 - 0.7 K/uL   Basophils Relative 1  0 - 1 %   Basophils Absolute 0.0  0.0 - 0.1 K/uL  COMPREHENSIVE  METABOLIC PANEL      Result Value Ref Range   Sodium 138  137 - 147 mEq/L   Potassium 4.0  3.7 - 5.3 mEq/L   Chloride 103  96 - 112 mEq/L   CO2 23  19 - 32 mEq/L   Glucose, Bld 90  70 - 99 mg/dL   BUN 16  6 - 23 mg/dL   Creatinine, Ser 0.85  0.50 - 1.10 mg/dL   Calcium 8.9  8.4 - 10.5 mg/dL   Total Protein 6.4  6.0 - 8.3 g/dL   Albumin 3.3 (*) 3.5 - 5.2 g/dL   AST 53 (*) 0 - 37 U/L   ALT 133 (*) 0 - 35 U/L   Alkaline Phosphatase 75  39 - 117 U/L   Total Bilirubin 0.3  0.3 - 1.2 mg/dL   GFR calc non Af Amer 62 (*) >90 mL/min   GFR calc Af Amer 72 (*) >90 mL/min   Anion gap 12  5 - 15  CBG MONITORING, ED      Result Value Ref Range   Glucose-Capillary 81  70 - 99 mg/dL  I-STAT TROPOININ, ED      Result Value Ref Range   Troponin i, poc 0.01  0.00 - 0.08 ng/mL   Comment 3            No results found.   Imaging Review No results found.   EKG Interpretation   Date/Time:  Wednesday August 26 2014 14:21:24 EDT Ventricular Rate:  81 PR Interval:  175 QRS Duration: 155 QT Interval:  456 QTC Calculation: 529 R Axis:   -142 Text Interpretation:  Sinus rhythm Left bundle branch block No previous  ECGs available Confirmed by Otila Starn  MD, Javiana Anwar [phone]) on 08/26/2014  2:40:38 PM        CRITICAL CARE Performed by: Fredia Sorrow Total critical care time: 30 Critical care time was exclusive of separately billable procedures and treating other patients. Critical care was necessary to treat or prevent imminent or life-threatening deterioration. Critical care was time spent personally by me on the following activities: development of treatment plan with patient and/or surrogate as well as nursing, discussions with consultants, evaluation of patient's response to treatment, examination of patient, obtaining history from patient or surrogate, ordering and performing treatments and interventions, ordering and review of laboratory studies, ordering and review of radiographic  studies, pulse oximetry and re-evaluation of patient's condition.  MDM   Final diagnoses:  CVA (cerebral vascular accident)    Patient last seen normal at 9:30 this morning. Patient presents with symptoms consistent with a right middle cerebral artery stroke. Patient with left-sided weakness and left facial droop. Patient arrived  as a code stroke appropriately. Went to head CT. Report not crossing over but no evidence of any head bleed on the head CT. Patient out of the window for TPA. Stroke team present patient will be taken to interventional radiology for an angiogram to rule out as significant clot that can be removed.  Patient will be admitted to the neuro ICU.    Fredia Sorrow, MD 08/26/14 1525

## 2014-08-27 ENCOUNTER — Inpatient Hospital Stay (HOSPITAL_COMMUNITY): Payer: Medicare Other

## 2014-08-27 ENCOUNTER — Encounter (HOSPITAL_COMMUNITY): Payer: Self-pay | Admitting: Interventional Radiology

## 2014-08-27 DIAGNOSIS — I059 Rheumatic mitral valve disease, unspecified: Secondary | ICD-10-CM

## 2014-08-27 LAB — BLOOD GAS, ARTERIAL
Acid-base deficit: 2.1 mmol/L — ABNORMAL HIGH (ref 0.0–2.0)
Bicarbonate: 22.4 mEq/L (ref 20.0–24.0)
DRAWN BY: 331761
FIO2: 0.4 %
MECHVT: 490 mL
O2 Saturation: 97.6 %
PEEP: 5 cmH2O
Patient temperature: 94.5
RATE: 12 resp/min
TCO2: 23.7 mmol/L (ref 0–100)
pCO2 arterial: 35.9 mmHg (ref 35.0–45.0)
pH, Arterial: 7.4 (ref 7.350–7.450)
pO2, Arterial: 134 mmHg — ABNORMAL HIGH (ref 80.0–100.0)

## 2014-08-27 LAB — CBC WITH DIFFERENTIAL/PLATELET
BASOS PCT: 1 % (ref 0–1)
Basophils Absolute: 0 10*3/uL (ref 0.0–0.1)
EOS ABS: 0.1 10*3/uL (ref 0.0–0.7)
Eosinophils Relative: 3 % (ref 0–5)
HEMATOCRIT: 28.6 % — AB (ref 36.0–46.0)
HEMOGLOBIN: 9.2 g/dL — AB (ref 12.0–15.0)
Lymphocytes Relative: 12 % (ref 12–46)
Lymphs Abs: 0.6 10*3/uL — ABNORMAL LOW (ref 0.7–4.0)
MCH: 27.8 pg (ref 26.0–34.0)
MCHC: 32.2 g/dL (ref 30.0–36.0)
MCV: 86.4 fL (ref 78.0–100.0)
MONO ABS: 0.4 10*3/uL (ref 0.1–1.0)
MONOS PCT: 9 % (ref 3–12)
NEUTROS ABS: 3.7 10*3/uL (ref 1.7–7.7)
NEUTROS PCT: 75 % (ref 43–77)
Platelets: 187 10*3/uL (ref 150–400)
RBC: 3.31 MIL/uL — ABNORMAL LOW (ref 3.87–5.11)
RDW: 14.1 % (ref 11.5–15.5)
WBC: 4.9 10*3/uL (ref 4.0–10.5)

## 2014-08-27 LAB — BASIC METABOLIC PANEL
ANION GAP: 10 (ref 5–15)
BUN: 13 mg/dL (ref 6–23)
CHLORIDE: 108 meq/L (ref 96–112)
CO2: 22 mEq/L (ref 19–32)
CREATININE: 0.77 mg/dL (ref 0.50–1.10)
Calcium: 7.6 mg/dL — ABNORMAL LOW (ref 8.4–10.5)
GFR, EST AFRICAN AMERICAN: 88 mL/min — AB (ref >90)
GFR, EST NON AFRICAN AMERICAN: 76 mL/min — AB (ref >90)
Glucose, Bld: 109 mg/dL — ABNORMAL HIGH (ref 70–99)
Potassium: 4.2 mEq/L (ref 3.7–5.3)
Sodium: 140 mEq/L (ref 137–147)

## 2014-08-27 LAB — LIPID PANEL
CHOLESTEROL: 71 mg/dL (ref 0–200)
HDL: 29 mg/dL — AB (ref >39)
LDL CALC: 22 mg/dL (ref 0–99)
Total CHOL/HDL Ratio: 2.4 RATIO
Triglycerides: 99 mg/dL (ref <150)
VLDL: 20 mg/dL (ref 0–40)

## 2014-08-27 LAB — MRSA PCR SCREENING: MRSA by PCR: NEGATIVE

## 2014-08-27 LAB — HEMOGLOBIN A1C
Hgb A1c MFr Bld: 6.6 % — ABNORMAL HIGH (ref <5.7)
Mean Plasma Glucose: 143 mg/dL — ABNORMAL HIGH (ref <117)

## 2014-08-27 MED ORDER — ASPIRIN 300 MG RE SUPP
300.0000 mg | Freq: Every day | RECTAL | Status: DC
Start: 2014-08-27 — End: 2014-08-28
  Administered 2014-08-27: 300 mg via RECTAL
  Filled 2014-08-27 (×2): qty 1

## 2014-08-27 MED ORDER — CETYLPYRIDINIUM CHLORIDE 0.05 % MT LIQD
7.0000 mL | Freq: Two times a day (BID) | OROMUCOSAL | Status: DC
  Administered 2014-08-27 – 2014-08-28 (×2): 7 mL via OROMUCOSAL

## 2014-08-27 NOTE — Progress Notes (Signed)
PT Cancellation Note  Patient Details Name: Susan [name] MRN: [id number] DOB: [birth year]   Cancelled Treatment:    Reason Eval/Treat Not Completed: Medical issues which prohibited therapy.  Bed rest and intubated. 08/27/2014  Donnella Sham, [place] [phone]  (pager)   Marsella Suman, Tessie Fass 08/27/2014, 5:54 PM

## 2014-08-27 NOTE — Progress Notes (Signed)
PULMONARY / CRITICAL CARE MEDICINE   Name: Susan [name] MRN: [id number] DOB: 22-Jun-1929    ADMISSION DATE:  08/26/2014 CONSULTATION DATE:  08/27/2014  REFERRING MD :  Armida Sans  CHIEF COMPLAINT:  CVA s/p IR revascularization  INITIAL PRESENTATION:  80 yo female with Lt hemiparesis from Rt MCA CVA.  Had revascularization for total occlusion of right ICA terminus, right MCA and right ACA.  She returned to the ICU on the vent and PCCM was consulted.  STUDIES:  CT Head 9/9 >>> Rt MCA density concerning for acute thrombus.  SIGNIFICANT EVENTS: 9/9 - brought to ED as code stroke, went to IR for revascularization, returned to ICU on vent.  SUBJECTIVE:  Tolerating SBT.  VITAL SIGNS: Temp:  [92.9 F (33.8 C)-97.7 F (36.5 C)] 97.4 F (36.3 C) (09/10 0302) Pulse Rate:  [48-86] 70 (09/10 0800) Resp:  [11-27] 12 (09/10 0800) BP: (89-163)/(48-87) 117/66 mmHg (09/10 0800) SpO2:  [87 %-100 %] 100 % (09/10 0800) Arterial Line BP: (101-165)/(53-89) 133/70 mmHg (09/10 0800) FiO2 (%):  [30 %-100 %] 30 % (09/10 0833) Weight:  [190 lb 0.6 oz (86.2 kg)-195 lb 4.8 oz (88.587 kg)] 190 lb 0.6 oz (86.2 kg) (09/09 1905) VENTILATOR SETTINGS: Vent Mode:  [-] PSV;CPAP FiO2 (%):  [30 %-100 %] 30 % Set Rate:  [12 bmp-16 bmp] 12 bmp Vt Set:  [440 mL-550 mL] 490 mL PEEP:  [5 cmH20] 5 cmH20 Pressure Support:  [5 cmH20] 5 cmH20 Plateau Pressure:  [17 cmH20-23 cmH20] 18 cmH20 INTAKE / OUTPUT: Intake/Output     09/09 0701 - 09/10 0700 09/10 0701 - 09/11 0700   I.V. (mL/kg) 2292 (26.6)    Total Intake(mL/kg) 2292 (26.6)    Urine (mL/kg/hr) 1915    Blood 200    Total Output 2115     Net +177            PHYSICAL EXAMINATION: General: no distress Neuro: alert, normal strength, moves all extremities, follows commands HEENT: ETT in place Cardiovascular: regular  Lungs: no wheeze Abdomen: soft, non tender  Musculoskeletal: no edema  Skin: no rashes  LABS:  CBC  Recent Labs Lab 08/26/14 1423  08/26/14 1659 08/27/14 0415  WBC 8.3  --  4.9  HGB 11.3* 9.2* 9.2*  HCT 35.5* 27.0* 28.6*  PLT 207  --  187   Coag's  Recent Labs Lab 08/26/14 1423  APTT 25  INR 1.13   BMET  Recent Labs Lab 08/26/14 1423 08/26/14 1659 08/27/14 0415  NA 138 140 140  K 4.0 3.7 4.2  CL 103  --  108  CO2 23  --  22  BUN 16  --  13  CREATININE 0.85  --  0.77  GLUCOSE 90 111* 109*   Electrolytes  Recent Labs Lab 08/26/14 1423 08/27/14 0415  CALCIUM 8.9 7.6*   ABG  Recent Labs Lab 08/27/14 0041  PHART 7.400  PCO2ART 35.9  PO2ART 134.0*   Liver Enzymes  Recent Labs Lab 08/26/14 1423  AST 53*  ALT 133*  ALKPHOS 75  BILITOT 0.3  ALBUMIN 3.3*   Cardiac Enzymes  Recent Labs Lab 08/26/14 2200  TROPONINI <0.30   Glucose  Recent Labs Lab 08/26/14 1436  GLUCAP 81    Imaging Ct Head Wo Contrast  08/27/2014   CLINICAL DATA:  Followup stroke.  EXAM: CT HEAD WITHOUT CONTRAST  TECHNIQUE: Contiguous axial images were obtained from the base of the skull through the vertex without intravenous contrast.  COMPARISON:  CT of  the head August 26, 2014  FINDINGS: 8 mm ovoid density in right sylvian fissure, increased from prior examination with trace surrounding residual subarachnoid blood. No midline shift or mass effect. Mild white matter changes suggest chronic [place] vessel ischemic disease. No intraparenchymal hemorrhage. Ventricles and sulci are overall normal for patient's age.  No abnormal extra-axial fluid collections. Trace calcific atherosclerosis of the carotid siphons. No paranasal sinus air-fluid levels. The mastoid air cells are well aerated. Severe bilateral temporomandibular osteoarthrosis.  IMPRESSION: Rounded sub cm density in the right sylvian fissure, is unclear if this reflects focal subarachnoid blood or, enhancing thrombus, with residual surrounding subarachnoid blood. No CT findings of acute large MCA territory infarct.   Electronically Signed   By: Elon Alas   On: 08/27/2014 05:46   Ct Head Wo Contrast  08/26/2014   CLINICAL DATA:  Post thrombectomy, RIGHT middle cerebral artery occlusion, followup  EXAM: CT HEAD WITHOUT CONTRAST  TECHNIQUE: Contiguous axial images were obtained from the base of the skull through the vertex without intravenous contrast.  COMPARISON:  Earlier exam of 08/26/2014  FINDINGS: Mild atrophy.  Stable ventricular morphology.  No midline shift.  Foci of high attenuation are identified along the course of the RIGHT middle cerebral artery within the RIGHT sylvian fissure.  This likely represents residual contrast within the thrombus of the RIGHT middle cerebral artery.  Developing RIGHT insular and suspect RIGHT basal ganglia infarcts.  No definite intracranial hemorrhage.  RIGHT MCA at skullbase is less dense.  LEFT hemisphere is unchanged.  IMPRESSION: Foci of high attenuation along the course of the RIGHT middle cerebral artery within the RIGHT sylvian fissure likely representing residual contrast within thrombus of the RIGHT MCA post angiography.  Developing RIGHT insular and suspect basal ganglia infarcts.  No definite intracranial hemorrhage.   Electronically Signed   By: Lavonia Dana M.D.   On: 08/26/2014 19:09   Ct Head (brain) Wo Contrast  08/26/2014   CLINICAL DATA:  Code stroke.  Left-sided weakness.  EXAM: CT HEAD WITHOUT CONTRAST  TECHNIQUE: Contiguous axial images were obtained from the base of the skull through the vertex without intravenous contrast.  COMPARISON:  06/09/2012  FINDINGS: There is new, asymmetric hyperattenuation of the proximal right MCA. There is question of very subtle, early hypoattenuation in the right MCA territory in the right temporoparietal region. There is no acute intracranial hemorrhage, mass, midline shift, or extra-axial fluid collection. Ventricles and sulci are within normal limits for age. Patchy hypodensities in the deep cerebral white matter bilaterally are nonspecific but compatible with  minimal chronic [place] vessel ischemic disease, similar to the prior study.  Prior bilateral cataract extraction is noted. Mastoid air cells are clear. Burzynski right maxillary sinus mucous retention cyst is partially visualized. Mild degenerative changes are noted at the temporomandibular joints. Mild bilateral carotid siphon and intracranial vertebral artery calcification is noted.  IMPRESSION: 1. New dense right MCA, concerning for acute thrombus. Very early edema is questioned in the right temporoparietal region. 2. No acute intracranial hemorrhage. Critical Value/emergent results were called by telephone at the time of interpretation on 08/26/2014 at 2:55 p.m. to Dr. Aram Beecham, who verbally acknowledged these results.   Electronically Signed   By: Logan Bores   On: 08/26/2014 15:01   Dg Chest Port 1 View  08/27/2014   CLINICAL DATA:  Endotracheal tube placement.  EXAM: PORTABLE CHEST - 1 VIEW  COMPARISON:  12/29/2013  FINDINGS: Endotracheal tube was placed with tip measuring 5.4 cm above the  carinal. Enteric tube tip is off the field of view but below the left hemidiaphragm. Cardiac pacemaker. Shallow inspiration. The cardiac enlargement with mild central pulmonary vascular congestion. No edema or consolidation. No pneumothorax.  IMPRESSION: Appliances appear to be in satisfactory location. Shallow inspiration. Cardiac enlargement. Mild pulmonary vascular congestion.   Electronically Signed   By: Lucienne Capers M.D.   On: 08/27/2014 00:00    ASSESSMENT / PLAN:  NEUROLOGIC A:   Acute right MCA infarct s/p IR revascularization. P:   RASS goal: 0 Rest per neurology  PULMONARY OETT 9/9 >>> A: VDRF in the setting of acute right MCA infarct. P:   Pressure support wean as tolerated >> likely can extubate after femoral sheaths removed, and MRI done  CARDIOVASCULAR A:  Hx hypercholesterolemia. Hx A-fib - now in NSR. LBB on EKG 9/9 - ? Old, no prior EKG's on record. P:  F/u Echo Need to have  family bring in home med list  RENAL A:   No acute issues. P:   Monitor renal fx, urine outpt, electrolytes  GASTROINTESTINAL A:   Nutrition. P:   NPO. Speech to assess swallowing after extubation SUP:  Pantoprzole.  HEMATOLOGIC A:   Anemia of critical illness >> no evidence for bleeding. P:  F/u CBC SCD for DVT prevention  INFECTIOUS A:   No evidence of infection. P:   Monitor fever curve / WBC's.  ENDOCRINE A:   No known issues. P:   Monitor glucose on BMP   TODAY'S SUMMARY:  Respiratory mechanics favorable for extubation.  Will wait until she has femoral sheaths removed and MRI done.  CC time 35 minutes.  Chesley Mires, MD [facility] Pulmonary/Critical Care 08/27/2014, 8:53 AM Pager:  [phone] After 3pm call: [phone]

## 2014-08-27 NOTE — Progress Notes (Signed)
STROKE TEAM PROGRESS NOTE   HISTORY Susan [name] is an 80 y.o. female with a past medical history significant for HTN, hypercholesterolemia, atrial fibrillation off anticoagulants, s/p pacemaker placement, brought in via EMS as a code stroke due to acute onset of the above stated symptoms.  As per EMS, she was last known normal at 930 am today.  Husband said that he saw her normal at 930 am, spoke to her on the phone at 1145 am " she sound it normal on the phone, but when I came back home she was weak in the left side, left face, and couldn't talk normal".  Patient denies HA, vertigo, double vision, visual disturbances, CP, SOB, or palpitations.  Initial NIHSS 10.  CT brain showed a hyperdense right MCA but no areas of hemorrhage or ischemia.  Date last known well: 08/26/14  Time last known well: 9: 30 am  tPA Given: no, out of the window for IV tpa but patient taken to endovascular intervention  NIHSS: 10 . She underwent emergent mechanical embolectomy by Dr Estanislado Pandy S/P bilateral common Carotid arteriograms followed complete revascularization of T occlusion of RT ICA terminus ,RT MCA and RT ACA using 10 mg of superselective IA integrelin and x 1 pass with the Solitaire FR device.  Persistent occlusion of of Prox RT ICA despite use of solitaire Fr x2 and the penumbra aspiration system./  Rt MCA and RT ACA And RT ICA terminus fill via ACOM from LT ICA promptly,. She was admitted to the neuro ICU for further evaluation and treatment.   SUBJECTIVE (INTERVAL HISTORY) Her  Husband is at the bedside.  Overall she feels her condition is gradually improving. She had successful mechanical embolectomy with complete recanalization. Blood pressure has been tightly controlled. She has been awake and following commands and moving all 4 extremities. The patient's husband states that patient has atrial fibrillation but is unable to tell me why she was not on anticoagulation.   OBJECTIVE Temp:  [92.9 F (33.8  C)-97.7 F (36.5 C)] 97.6 F (36.4 C) (09/10 1245) Pulse Rate:  [48-86] 78 (09/10 1245) Cardiac Rhythm:  [-]  Resp:  [11-31] 23 (09/10 1245) BP: (89-163)/(48-87) 101/51 mmHg (09/10 1245) SpO2:  [87 %-100 %] 100 % (09/10 1245) Arterial Line BP: (101-165)/(53-89) 131/67 mmHg (09/10 0900) FiO2 (%):  [30 %-100 %] 30 % (09/10 1155) Weight:  [190 lb 0.6 oz (86.2 kg)-195 lb 4.8 oz (88.587 kg)] 190 lb 0.6 oz (86.2 kg) (09/09 1905)   Recent Labs Lab 08/26/14 1436  GLUCAP 81    Recent Labs Lab 08/26/14 1423 08/26/14 1659 08/27/14 0415  NA 138 140 140  K 4.0 3.7 4.2  CL 103  --  108  CO2 23  --  22  GLUCOSE 90 111* 109*  BUN 16  --  13  CREATININE 0.85  --  0.77  CALCIUM 8.9  --  7.6*    Recent Labs Lab 08/26/14 1423  AST 53*  ALT 133*  ALKPHOS 75  BILITOT 0.3  PROT 6.4  ALBUMIN 3.3*    Recent Labs Lab 08/26/14 1423 08/26/14 1659 08/27/14 0415  WBC 8.3  --  4.9  NEUTROABS 6.4  --  3.7  HGB 11.3* 9.2* 9.2*  HCT 35.5* 27.0* 28.6*  MCV 88.1  --  86.4  PLT 207  --  187    Recent Labs Lab 08/26/14 2200  TROPONINI <0.30    Recent Labs  08/26/14 1423  LABPROT 14.5  INR 1.13  Recent Labs  08/26/14 1448  COLORURINE AMBER*  LABSPEC 1.024  PHURINE 5.0  GLUCOSEU NEGATIVE  HGBUR TRACE*  BILIRUBINUR NEGATIVE  KETONESUR NEGATIVE  PROTEINUR 30*  UROBILINOGEN 1.0  NITRITE NEGATIVE  LEUKOCYTESUR NEGATIVE       Component Value Date/Time   CHOL 71 08/27/2014 0415   TRIG 99 08/27/2014 0415   HDL 29* 08/27/2014 0415   CHOLHDL 2.4 08/27/2014 0415   VLDL 20 08/27/2014 0415   LDLCALC 22 08/27/2014 0415   Lab Results  Component Value Date   HGBA1C 6.6* 08/27/2014      Component Value Date/Time   LABOPIA NONE DETECTED 08/26/2014 1448   COCAINSCRNUR NONE DETECTED 08/26/2014 1448   LABBENZ NONE DETECTED 08/26/2014 1448   AMPHETMU NONE DETECTED 08/26/2014 1448   THCU NONE DETECTED 08/26/2014 1448   LABBARB NONE DETECTED 08/26/2014 1448    No results found for this  basename: ETH,  in the last 168 hours  Ct Head Wo Contrast  08/27/2014   CLINICAL DATA:  Followup stroke.  EXAM: CT HEAD WITHOUT CONTRAST  TECHNIQUE: Contiguous axial images were obtained from the base of the skull through the vertex without intravenous contrast.  COMPARISON:  CT of the head August 26, 2014  FINDINGS: 8 mm ovoid density in right sylvian fissure, increased from prior examination with trace surrounding residual subarachnoid blood. No midline shift or mass effect. Mild white matter changes suggest chronic [place] vessel ischemic disease. No intraparenchymal hemorrhage. Ventricles and sulci are overall normal for patient's age.  No abnormal extra-axial fluid collections. Trace calcific atherosclerosis of the carotid siphons. No paranasal sinus air-fluid levels. The mastoid air cells are well aerated. Severe bilateral temporomandibular osteoarthrosis.  IMPRESSION: Rounded sub cm density in the right sylvian fissure, is unclear if this reflects focal subarachnoid blood or, enhancing thrombus, with residual surrounding subarachnoid blood. No CT findings of acute large MCA territory infarct.   Electronically Signed   By: Elon Alas   On: 08/27/2014 05:46   Ct Head Wo Contrast  08/26/2014   CLINICAL DATA:  Post thrombectomy, RIGHT middle cerebral artery occlusion, followup  EXAM: CT HEAD WITHOUT CONTRAST  TECHNIQUE: Contiguous axial images were obtained from the base of the skull through the vertex without intravenous contrast.  COMPARISON:  Earlier exam of 08/26/2014  FINDINGS: Mild atrophy.  Stable ventricular morphology.  No midline shift.  Foci of high attenuation are identified along the course of the RIGHT middle cerebral artery within the RIGHT sylvian fissure.  This likely represents residual contrast within the thrombus of the RIGHT middle cerebral artery.  Developing RIGHT insular and suspect RIGHT basal ganglia infarcts.  No definite intracranial hemorrhage.  RIGHT MCA at skullbase is  less dense.  LEFT hemisphere is unchanged.  IMPRESSION: Foci of high attenuation along the course of the RIGHT middle cerebral artery within the RIGHT sylvian fissure likely representing residual contrast within thrombus of the RIGHT MCA post angiography.  Developing RIGHT insular and suspect basal ganglia infarcts.  No definite intracranial hemorrhage.   Electronically Signed   By: Lavonia Dana M.D.   On: 08/26/2014 19:09   Ct Head (brain) Wo Contrast  08/26/2014   CLINICAL DATA:  Code stroke.  Left-sided weakness.  EXAM: CT HEAD WITHOUT CONTRAST  TECHNIQUE: Contiguous axial images were obtained from the base of the skull through the vertex without intravenous contrast.  COMPARISON:  06/09/2012  FINDINGS: There is new, asymmetric hyperattenuation of the proximal right MCA. There is question of very subtle, early hypoattenuation  in the right MCA territory in the right temporoparietal region. There is no acute intracranial hemorrhage, mass, midline shift, or extra-axial fluid collection. Ventricles and sulci are within normal limits for age. Patchy hypodensities in the deep cerebral white matter bilaterally are nonspecific but compatible with minimal chronic [place] vessel ischemic disease, similar to the prior study.  Prior bilateral cataract extraction is noted. Mastoid air cells are clear. [place] right maxillary sinus mucous retention cyst is partially visualized. Mild degenerative changes are noted at the temporomandibular joints. Mild bilateral carotid siphon and intracranial vertebral artery calcification is noted.  IMPRESSION: 1. New dense right MCA, concerning for acute thrombus. Very early edema is questioned in the right temporoparietal region. 2. No acute intracranial hemorrhage. Critical Value/emergent results were called by telephone at the time of interpretation on 08/26/2014 at 2:55 p.m. to Dr. Aram Beecham, who verbally acknowledged these results.   Electronically Signed   By: Logan Bores   On: 08/26/2014  15:01   Dg Chest Port 1 View  08/27/2014   CLINICAL DATA:  Endotracheal tube placement.  EXAM: PORTABLE CHEST - 1 VIEW  COMPARISON:  12/29/2013  FINDINGS: Endotracheal tube was placed with tip measuring 5.4 cm above the carinal. Enteric tube tip is off the field of view but below the left hemidiaphragm. Cardiac pacemaker. Shallow inspiration. The cardiac enlargement with mild central pulmonary vascular congestion. No edema or consolidation. No pneumothorax.  IMPRESSION: Appliances appear to be in satisfactory location. Shallow inspiration. Cardiac enlargement. Mild pulmonary vascular congestion.   Electronically Signed   By: Lucienne Capers M.D.   On: 08/27/2014 00:00     PHYSICAL EXAM Elderly Caucasian lady not in distress. Patient is intubated and has groin arterial sheaths.. Afebrile. Head is nontraumatic. Neck is supple without bruit.  . Cardiac exam no murmur or gallop. Lungs are clear to auscultation. Distal pulses are well felt. Neurological Exam : Awake and interactive. Extraocular moments are full range without nystagmus. Fundi were not visualized. Vision acuity cannot be reliably tested. Blinks to threat more on the right than the left. Mild left lower facial weakness. Tongue midline. Able to move all 4 extremities against gravity but mild weakness of the left hip and intrinsic hand muscles. Lower extremity exam limited due to bilateral groin sheaths  ASSESSMENT/PLAN  Susan [name] is a 80 y.o. female with history of HTN, hypercholesterolemia, atrial fibrillation off anticoagulants, s/p pacemaker placement  presenting with dysarthria and left face weakness and left hemiplegia. She   not receive IV t-PA   due to delay in arrival outside TPA window. Suspect right MCA infarct secondary to terminal ICA T. occlusion status post mechanical embolectomy with complete recannulization using 10 mg of superselective IA integrelin and x 1 pass with the Solitaire FR device. . persistent occlusion of  the proximal internal carotid artery in the neck. Stroke work up underway.      aspirin prior to admission, now on aspirin 300 mg rectally every day  MRI  Cannot be done as pacer  MRA  Cannot be done as pacer    Carotid Doppler  Not needed as cerebral angiogram done  2D Echo  pending    LDL 22 on crestor 5mg  daily PTA  HgbA1c 6.6     SCDs for VTE prophylaxis    NPO.   Bedrest    Resultant mild left hemiparesis  Therapy needs:  pending       Disposition: pending    Other Stroke Risk Factors- , hyperlipidemia, atrial fibrillation.  Remote history of breast cancer    Plan to discontinue arterial sheaths today. Extubation if positive after that. MRI cannot done due to pacemaker. Repeat CT scan of the head without contrast in the morning. When patient is able to swallow will need to start oral anticoagulation. Start aspirin and today.    I had a long discussion with the patient's husband Re: stroke, neurological findings, plan for evaluation, treatment and answered questions.         Hospital day # 1  This patient is critically ill and at significant risk of neurological worsening, death and care requires constant monitoring of vital signs, hemodynamics,respiratory and cardiac monitoring,review of multiple databases, neurological assessment, discussion with family, other specialists and medical decision making of high complexity.I have made any additions or clarifications directly to the above note.  I spent 40 minutes of neurocritical care time  in the care of  this patient.      To contact Stroke Continuity provider, please refer to [email or web address] After hours, contact General Neurology

## 2014-08-27 NOTE — Progress Notes (Signed)
*  PRELIMINARY RESULTS* Vascular Ultrasound Carotid Duplex (Doppler) has been completed. There is no flow noted within the right internal carotid artery, suggestive of occlusion. The left internal carotid artery exhibits 1-39% stenosis. Vertebral arteries are patent with antegrade flow.  08/27/2014 4:13 PM Maudry Mayhew, RVT, RDCS, RDMS

## 2014-08-27 NOTE — Progress Notes (Signed)
R groin 71F sheath removed by Kristopher, IR tech. Exoseal applied to R groin site at 09:50 and pressure held. Hemostasis achieved at 10:15. L groin 53F Aline removed by Nira Conn, IR tech at 10:05. Pressure held and hemostasis achieved at 10:15. Pressure dressings applied and reviewed with pt's RN. Bilateral sites level 0. Pulses palpated bilaterally.

## 2014-08-27 NOTE — Progress Notes (Signed)
Assessed patient at bedside for extubation.  Patient has weaned on PSV 5/5 for most of day without distress.  She has had sheath removed and has cleared HOB flat requirements.  She is awake, alert, follows commands, able to raise head off pillow, pull Vt of 400-800.    Plan: Proceed with extubation per RT NPO until seen by SLP  Defer activity to Neuro Pulmonary hygiene  Noe Gens, NP-C [facility] Pgr: [phone] or [phone]

## 2014-08-27 NOTE — Procedures (Signed)
Extubation Procedure Note  Patient Details:   Name: Susan [name] DOB: Jul 06, 1929 MRN: [id number]   Airway Documentation:     Evaluation  O2 sats: stable throughout Complications: No apparent complications Patient did tolerate procedure well. Bilateral Breath Sounds:  (having Echo done @ this time) Suctioning: Oral;Airway Yes, Order placed for extubation after X2 Shealths pulled from procedure, weaned all day on 5/5(c/ps), pt. able to lift/hold head off bed on command, (+) cuff leak, pulled NIF(-20) cmh20, FVC(.8)L, no Stridor noted s/p extubation, placed on 2 lpm n/c humidified oxygen, I/S started with great effort pulled 1000cc X3, RN @ bedside, RT to monitor  Winferd Humphrey 08/27/2014, 4:27 PM

## 2014-08-27 NOTE — Progress Notes (Signed)
Subjective: Patient presented with left hemiparesis, left facial weakness and dysarthria on 9/9 s/p IR bilateral common carotid arteriograms followed complete revascularization of Total occlusion of RT ICA terminus ,RT MCA and RT ACA using 10 mg of superselective IA integrelin and x 1 pass with the Solitaire FR device. Persistent occlusion of of Prox RT ICA despite use of solitaire Fr x2 and the penumbra aspiration system. Rt MCA and RT ACA And RT ICA terminus fill via ACOM from LT ICA promptly. CT head 0546 with rounded sub cm density in the right sylvian fissure, unclear if this reflects focal subarachnoid blood or, enhancing thrombus, with residual surrounding subarachnoid blood.  Objective: Physical Exam: BP 117/66  Pulse 70  Temp(Src) 97.4 F (36.3 C) (Oral)  Resp 12  Ht 5\' 4"  (1.626 m)  Wt 190 lb 0.6 oz (86.2 kg)  BMI 32.60 kg/m2  SpO2 100%  General: Intubated but alert and following commands and tracking with eyes Abd: Soft, NT, ND Ext: RCFA sheath intact dressing C/D, soft, NT, no signs of bleeding or hematoma, LCFA sheath intact, dressing with bright red blood, site soft, NT, no signs of hematoma, DP 2+ bilaterally Neuro: Able to open and close eyes bilaterally, follows commands, EOMI, slight LUE drift, equal strength upper and lower extremities.   Labs: CBC  Recent Labs  08/26/14 1423 08/26/14 1659 08/27/14 0415  WBC 8.3  --  4.9  HGB 11.3* 9.2* 9.2*  HCT 35.5* 27.0* 28.6*  PLT 207  --  187   BMET  Recent Labs  08/26/14 1423 08/26/14 1659 08/27/14 0415  NA 138 140 140  K 4.0 3.7 4.2  CL 103  --  108  CO2 23  --  22  GLUCOSE 90 111* 109*  BUN 16  --  13  CREATININE 0.85  --  0.77  CALCIUM 8.9  --  7.6*   LFT  Recent Labs  08/26/14 1423  PROT 6.4  ALBUMIN 3.3*  AST 53*  ALT 133*  ALKPHOS 75  BILITOT 0.3   PT/INR  Recent Labs  08/26/14 1423  LABPROT 14.5  INR 1.13     Studies/Results: Ct Head Wo Contrast  08/27/2014   CLINICAL DATA:   Followup stroke.  EXAM: CT HEAD WITHOUT CONTRAST  TECHNIQUE: Contiguous axial images were obtained from the base of the skull through the vertex without intravenous contrast.  COMPARISON:  CT of the head August 26, 2014  FINDINGS: 8 mm ovoid density in right sylvian fissure, increased from prior examination with trace surrounding residual subarachnoid blood. No midline shift or mass effect. Mild white matter changes suggest chronic [place] vessel ischemic disease. No intraparenchymal hemorrhage. Ventricles and sulci are overall normal for patient's age.  No abnormal extra-axial fluid collections. Trace calcific atherosclerosis of the carotid siphons. No paranasal sinus air-fluid levels. The mastoid air cells are well aerated. Severe bilateral temporomandibular osteoarthrosis.  IMPRESSION: Rounded sub cm density in the right sylvian fissure, is unclear if this reflects focal subarachnoid blood or, enhancing thrombus, with residual surrounding subarachnoid blood. No CT findings of acute large MCA territory infarct.   Electronically Signed   By: Elon Alas   On: 08/27/2014 05:46   Ct Head Wo Contrast  08/26/2014   CLINICAL DATA:  Post thrombectomy, RIGHT middle cerebral artery occlusion, followup  EXAM: CT HEAD WITHOUT CONTRAST  TECHNIQUE: Contiguous axial images were obtained from the base of the skull through the vertex without intravenous contrast.  COMPARISON:  Earlier exam of 08/26/2014  FINDINGS:  Mild atrophy.  Stable ventricular morphology.  No midline shift.  Foci of high attenuation are identified along the course of the RIGHT middle cerebral artery within the RIGHT sylvian fissure.  This likely represents residual contrast within the thrombus of the RIGHT middle cerebral artery.  Developing RIGHT insular and suspect RIGHT basal ganglia infarcts.  No definite intracranial hemorrhage.  RIGHT MCA at skullbase is less dense.  LEFT hemisphere is unchanged.  IMPRESSION: Foci of high attenuation along the  course of the RIGHT middle cerebral artery within the RIGHT sylvian fissure likely representing residual contrast within thrombus of the RIGHT MCA post angiography.  Developing RIGHT insular and suspect basal ganglia infarcts.  No definite intracranial hemorrhage.   Electronically Signed   By: Lavonia Dana M.D.   On: 08/26/2014 19:09   Ct Head (brain) Wo Contrast  08/26/2014   CLINICAL DATA:  Code stroke.  Left-sided weakness.  EXAM: CT HEAD WITHOUT CONTRAST  TECHNIQUE: Contiguous axial images were obtained from the base of the skull through the vertex without intravenous contrast.  COMPARISON:  06/09/2012  FINDINGS: There is new, asymmetric hyperattenuation of the proximal right MCA. There is question of very subtle, early hypoattenuation in the right MCA territory in the right temporoparietal region. There is no acute intracranial hemorrhage, mass, midline shift, or extra-axial fluid collection. Ventricles and sulci are within normal limits for age. Patchy hypodensities in the deep cerebral white matter bilaterally are nonspecific but compatible with minimal chronic [place] vessel ischemic disease, similar to the prior study.  Prior bilateral cataract extraction is noted. Mastoid air cells are clear. [place] right maxillary sinus mucous retention cyst is partially visualized. Mild degenerative changes are noted at the temporomandibular joints. Mild bilateral carotid siphon and intracranial vertebral artery calcification is noted.  IMPRESSION: 1. New dense right MCA, concerning for acute thrombus. Very early edema is questioned in the right temporoparietal region. 2. No acute intracranial hemorrhage. Critical Value/emergent results were called by telephone at the time of interpretation on 08/26/2014 at 2:55 p.m. to Dr. Aram Beecham, who verbally acknowledged these results.   Electronically Signed   By: Logan Bores   On: 08/26/2014 15:01   Dg Chest Port 1 View  08/27/2014   CLINICAL DATA:  Endotracheal tube placement.   EXAM: PORTABLE CHEST - 1 VIEW  COMPARISON:  12/29/2013  FINDINGS: Endotracheal tube was placed with tip measuring 5.4 cm above the carinal. Enteric tube tip is off the field of view but below the left hemidiaphragm. Cardiac pacemaker. Shallow inspiration. The cardiac enlargement with mild central pulmonary vascular congestion. No edema or consolidation. No pneumothorax.  IMPRESSION: Appliances appear to be in satisfactory location. Shallow inspiration. Cardiac enlargement. Mild pulmonary vascular congestion.   Electronically Signed   By: Lucienne Capers M.D.   On: 08/27/2014 00:00    Assessment/Plan: Left hemiparesis, left facial weakness and dysarthria Right ICA, MCA, ACA infarct S/p revascularization 9/9 with persistent occlusion of Prox RT ICA. Moving all 4 extremities and following commands, plan for extubation later today after sheath removal. Repeat CT head 9/11 and start antiplatelet therapy when Neurology approves. Plans per Neurology D/w Dr. Estanislado Pandy     LOS: 1 day    Tsosie Billing D PA-C 08/27/2014 9:52 AM

## 2014-08-27 NOTE — Progress Notes (Signed)
  Echocardiogram 2D Echocardiogram has been performed.  Susan [name] 08/27/2014, 12:06 PM

## 2014-08-27 NOTE — Plan of Care (Signed)
Problem: Consults Goal: Ischemic Stroke Patient Education See Patient Education Module for education specifics.  Outcome: Progressing Education on ischemic stroke, signs/ symptoms, when to call 911, and risk factors was given to patient and her husband with verbal confirmation of understanding.

## 2014-08-27 NOTE — Progress Notes (Signed)
SLP Cancellation Note  Patient Details Name: Susan [name] MRN: [id number] DOB: 06-19-1929   Cancelled treatment:       Reason Eval/Treat Not Completed: Patient not medically ready. Per chart, pt still intubated. Will check back tomorrow.   Matalynn Graff, Katherene Ponto 08/27/2014, 7:14 AM

## 2014-08-27 NOTE — Progress Notes (Signed)
Pt. Was transported to CT & back to 3M12 without any complications.

## 2014-08-27 NOTE — Progress Notes (Signed)
81mL propofol from 146mL bottle wasted with Hendricks Milo, RN as witness. Lenon Oms 08/27/2014 5:07 PM

## 2014-08-27 NOTE — Progress Notes (Signed)
OT Cancellation Note  Patient Details Name: Susan [name] MRN: [id number] DOB: December 27, 1928   Cancelled Treatment:    Reason Eval/Treat Not Completed: Patient not medically ready  Bell 08/27/2014, 4:49 PM

## 2014-08-27 NOTE — Progress Notes (Signed)
UR completed.  Ashleyann Shoun, RN BSN [facility] CCM Trauma/Neuro ICU Case Manager [phone]  

## 2014-08-28 ENCOUNTER — Inpatient Hospital Stay (HOSPITAL_COMMUNITY): Payer: Medicare Other

## 2014-08-28 DIAGNOSIS — I6340 Cerebral infarction due to embolism of unspecified cerebral artery: Secondary | ICD-10-CM | POA: Diagnosis not present

## 2014-08-28 LAB — CBC
HEMATOCRIT: 29.9 % — AB (ref 36.0–46.0)
Hemoglobin: 9.6 g/dL — ABNORMAL LOW (ref 12.0–15.0)
MCH: 27.9 pg (ref 26.0–34.0)
MCHC: 32.1 g/dL (ref 30.0–36.0)
MCV: 86.9 fL (ref 78.0–100.0)
PLATELETS: 180 10*3/uL (ref 150–400)
RBC: 3.44 MIL/uL — ABNORMAL LOW (ref 3.87–5.11)
RDW: 14.4 % (ref 11.5–15.5)
WBC: 5.9 10*3/uL (ref 4.0–10.5)

## 2014-08-28 LAB — BASIC METABOLIC PANEL
Anion gap: 13 (ref 5–15)
BUN: 11 mg/dL (ref 6–23)
CALCIUM: 7.3 mg/dL — AB (ref 8.4–10.5)
CO2: 21 meq/L (ref 19–32)
CREATININE: 0.87 mg/dL (ref 0.50–1.10)
Chloride: 109 mEq/L (ref 96–112)
GFR calc Af Amer: 70 mL/min — ABNORMAL LOW (ref >90)
GFR calc non Af Amer: 60 mL/min — ABNORMAL LOW (ref >90)
GLUCOSE: 82 mg/dL (ref 70–99)
Potassium: 3.9 mEq/L (ref 3.7–5.3)
Sodium: 143 mEq/L (ref 137–147)

## 2014-08-28 MED ORDER — ASPIRIN 325 MG PO TABS: 325.0000 mg | ORAL_TABLET | Freq: Every day | ORAL | Status: DC

## 2014-08-28 MED ORDER — INFLUENZA VAC SPLIT QUAD 0.5 ML IM SUSY
0.5000 mL | PREFILLED_SYRINGE | Freq: Once | INTRAMUSCULAR | Status: AC
  Administered 2014-08-28: 0.5 mL via INTRAMUSCULAR
  Filled 2014-08-28: qty 0.5

## 2014-08-28 MED ORDER — HEPARIN SODIUM (PORCINE) 5000 UNIT/ML IJ SOLN
5000.0000 [IU] | Freq: Two times a day (BID) | INTRAMUSCULAR | Status: DC
  Administered 2014-08-29 – 2014-09-01 (×8): 5000 [IU] via SUBCUTANEOUS
  Filled 2014-08-28 (×8): qty 1

## 2014-08-28 MED ORDER — RESOURCE THICKENUP CLEAR PO POWD
ORAL | Status: DC | PRN
  Filled 2014-08-28: qty 125

## 2014-08-28 MED ORDER — MIDODRINE HCL 5 MG PO TABS
10.0000 mg | ORAL_TABLET | Freq: Three times a day (TID) | ORAL | Status: DC
  Administered 2014-08-28: 10 mg via ORAL
  Administered 2014-08-29: 5 mg via ORAL
  Administered 2014-08-29 – 2014-08-31 (×6): 10 mg via ORAL
  Filled 2014-08-28 (×3): qty 2

## 2014-08-28 MED ORDER — ASPIRIN 325 MG PO TABS
325.0000 mg | ORAL_TABLET | Freq: Every day | ORAL | Status: DC
  Administered 2014-08-28 – 2014-09-01 (×5): 325 mg via ORAL
  Filled 2014-08-28 (×5): qty 1

## 2014-08-28 NOTE — Progress Notes (Signed)
OT Cancellation Note  Patient Details Name: Susan [name] MRN: [id number] DOB: November 07, 1929   Cancelled Treatment:    Reason Eval/Treat Not Completed: Patient not medically ready (bedrest in order set)  Peri Maris Pager: [phone]  08/28/2014, 7:51 AM

## 2014-08-28 NOTE — Progress Notes (Signed)
D/c'd foley and groin site dressings.  Placed bandaids bilaterally to groin sites.  Got pt up to BR and pt complained of dizziness.  Pt seemed to do OK with strength, just c/o of dizziness (she states this is her baseline).

## 2014-08-28 NOTE — Evaluation (Signed)
Speech Language Pathology Evaluation Patient Details Name: KIESHA ENSEY MRN: [id number] DOB: [birth year] Today's Date: 08/28/2014 Time: 1478-2956 SLP Time Calculation (min): 23 min  Problem List:  Patient Active Problem List   Diagnosis Date Noted  . CVA (cerebral infarction) 08/26/2014  . SKIN LESION 04/21/2008  . HYPERLIPIDEMIA 12/31/2007  . OSTEOPOROSIS 12/31/2007  . FATIGUE 12/31/2007  . ELEVATED BLOOD PRESSURE WITHOUT DIAGNOSIS OF HYPERTENSION 12/31/2007  . DIABETES MELLITUS, TYPE II 08/12/2007  . ALLERGIC RHINITIS 08/12/2007  . OSTEOARTHRITIS 08/12/2007  . COLONIC POLYPS, HX OF 08/12/2007  . ASTHMA 08/06/2007  . BREAST CANCER, HX OF 08/06/2007   Past Medical History:  Past Medical History  Diagnosis Date  . Hypercholesteremia   . A-fib   . Pacemaker   . Low blood pressure   . Cancer     breast   Past Surgical History:  Past Surgical History  Procedure Laterality Date  . Knee surgery Left   . Ankle surgery Right   . Pacemaker insertion  06/2014  . Breast surgery    . Cholecystectomy    . Radiology with anesthesia N/A 08/26/2014    Procedure: RADIOLOGY WITH ANESTHESIA;  Surgeon: Rob Hickman, MD;  Location: [facility];  Service: Radiology;  Laterality: N/A;   HPI:  Ms. DYANI BABEL is a 80 y.o. female with history of HTN, hypercholesterolemia, atrial fibrillation off anticoagulants, s/p pacemaker placement presenting with dysarthria and left face weakness and left hemiplegia. She not receive IV t-PA due to delay in arrival outside TPA window. CT head shows Rounded sub cm density in the right sylvian fissure. 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 Pt was intubated from 9/9 to 9/10.    Assessment / Plan / Recommendation Clinical Impression  No evidence of cognitive linguistic impairment. SLP will follow for dysphagia only.     SLP Assessment       Follow Up Recommendations  24 hour  supervision/assistance    Frequency and Duration min 2x/week      Pertinent Vitals/Pain Pain Assessment: No/denies pain   SLP Goals     SLP Evaluation Prior Functioning  Cognitive/Linguistic Baseline: Within functional limits Type of Home: House  Lives With: Spouse Available Help at Discharge: Available 24 hours/day Vocation: Retired   Associate Professor  Overall Cognitive Status: Within Functional Limits for tasks assessed Arousal/Alertness: Awake/alert Orientation Level: Oriented X4    Comprehension  Auditory Comprehension Overall Auditory Comprehension: Appears within functional limits for tasks assessed Reading Comprehension Reading Status: Within funtional limits    Expression Verbal Expression Overall Verbal Expression: Appears within functional limits for tasks assessed   Oral / Motor Oral Motor/Sensory Function Overall Oral Motor/Sensory Function: Appears within functional limits for tasks assessed Motor Speech Overall Motor Speech: Appears within functional limits for tasks assessed   GO    Herbie Baltimore, MA CCC-SLP [phone]  Lynann Beaver 08/28/2014, 8:42 AM

## 2014-08-28 NOTE — Progress Notes (Signed)
Paged Dr. Janann Colonel about pt's SBP.  Pt has been orthostatic while up and presenting with dizziness.  While sitting in the chair SBP is still in the 90's.  Pt not symptomatic at this time.  Dr. Janann Colonel to come talk with patient in a few minutes.  Will continue to monitor.

## 2014-08-28 NOTE — Evaluation (Signed)
Clinical/Bedside Swallow Evaluation Patient Details  Name: RHIANNA RAULERSON MRN: [id number] Date of Birth: 1929/06/28  Today's Date: 08/28/2014 Time: 0258-5277 SLP Time Calculation (min): 23 min  Past Medical History:  Past Medical History  Diagnosis Date  . Hypercholesteremia   . A-fib   . Pacemaker   . Low blood pressure   . Cancer     breast   Past Surgical History:  Past Surgical History  Procedure Laterality Date  . Knee surgery Left   . Ankle surgery Right   . Pacemaker insertion  06/2014  . Breast surgery    . Cholecystectomy    . Radiology with anesthesia N/A 08/26/2014    Procedure: RADIOLOGY WITH ANESTHESIA;  Surgeon: Rob Hickman, MD;  Location: [facility];  Service: Radiology;  Laterality: N/A;   HPI:  Ms. ALVIE SPELTZ is a 80 y.o. female with history of HTN, hypercholesterolemia, atrial fibrillation off anticoagulants, s/p pacemaker placement presenting with dysarthria and left face weakness and left hemiplegia. She not receive IV t-PA due to delay in arrival outside TPA window. CT head shows Rounded sub cm density in the right sylvian fissure. 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 Pt was intubated from 9/9 to 9/10.    Assessment / Plan / Recommendation Clinical Impression  Pt presents with evidence of an acute reversible dysphagia following brief intubation. Pt with hoarse vocal quality, complaint of throat pain and congested cough indicative of irritation to the airway following intubation. Trials of thin liquids result in immediate throat clearing, while other textures are followed by delayed cough, also present prior to PO trials. Suspect possible decreased airway protection; recommend nectar thick liquids and dys 3 (mechanical soft) solids as pt recovers. Given lack of evidence of neuromotor impairment, expect upgrade prior to d/c. Will follow for progress.     Aspiration Risk  Mild     Diet Recommendation Dysphagia 3 (Mechanical Soft);Nectar-thick liquid   Liquid Administration via: Cup Medication Administration: Whole meds with puree Supervision: Patient able to self feed Compensations: Slow rate;Punt sips/bites Postural Changes and/or Swallow Maneuvers: Seated upright 90 degrees    Other  Recommendations Oral Care Recommendations: Oral care BID Other Recommendations: Order thickener from pharmacy   Follow Up Recommendations  24 hour supervision/assistance    Frequency and Duration min 2x/week  1 week   Pertinent Vitals/Pain NA    SLP Swallow Goals     Swallow Study Prior Functional Status       General HPI: Ms. KAYLEA MOUNSEY is a 80 y.o. female with history of HTN, hypercholesterolemia, atrial fibrillation off anticoagulants, s/p pacemaker placement presenting with dysarthria and left face weakness and left hemiplegia. She not receive IV t-PA due to delay in arrival outside TPA window. CT head shows Rounded sub cm density in the right sylvian fissure. 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 Pt was intubated from 9/9 to 9/10.  Type of Study: Bedside swallow evaluation Previous Swallow Assessment: none Diet Prior to this Study: NPO Temperature Spikes Noted: No Respiratory Status: Room air History of Recent Intubation: Yes Length of Intubations (days): 2 days Date extubated: 08/27/14 Behavior/Cognition: Alert;Cooperative;Pleasant mood Oral Cavity - Dentition: Adequate natural dentition Self-Feeding Abilities: Able to feed self Patient Positioning: Upright in bed Baseline Vocal Quality: Hoarse Volitional Cough: Congested Volitional Swallow: Able to elicit    Oral/Motor/Sensory Function Overall Oral Motor/Sensory Function: Appears within functional limits  for tasks assessed   Ice Chips     Thin Liquid Thin Liquid: Impaired Presentation: Cup;Straw;Self Fed Pharyngeal  Phase  Impairments: Cough - Immediate;Cough - Delayed;Throat Clearing - Immediate    Nectar Thick Nectar Thick Liquid: Impaired Presentation: Cup;Self Fed Pharyngeal Phase Impairments: Throat Clearing - Delayed;Cough - Delayed   Honey Thick Honey Thick Liquid: Not tested   Puree Puree: Impaired Presentation: Self Fed;Spoon Pharyngeal Phase Impairments: Throat Clearing - Delayed;Cough - Delayed   Solid   GO    Solid: Impaired Presentation: Self Fed Pharyngeal Phase Impairments: Throat Clearing - Delayed;Cough - Delayed      Herbie Baltimore, MA CCC-SLP [phone]  Alois Mincer, Katherene Ponto 08/28/2014,8:38 AM

## 2014-08-28 NOTE — Evaluation (Addendum)
Physical Therapy Evaluation Patient Details Name: Susan [name] MRN: [id number] DOB: 08/30/1929 Today's Date: 08/28/2014   History of Present Illness  Pt is an 80 y.o. female presenting s/p hyperdense R MCA with L sided weakness (UE>LE), L facial weakness, and dyarthria. Pt with hx of HTN, aFib, and pacemaker placement 06/2014.  Pt was taken to IR and had revascularization for total occlusion of right ICA terminus, right MCA and right ACA. She returned to the ICU on the vent, extubated 9/10.  Clinical Impression  Patient demonstrates deficits in functional mobility as indicated below. Will benefit from continued skilled PT to address deficits and maximize function. Will see as indicated and progress as tolerated. Recommend HHPT upon acute discharge. OF NOTE: Assessment limited 2/2 orthostatic BP, patient reports that medicine not on board at this time. Spoke with spouse and patient extensively regarding POC and patient desire to return home. Encouraged ambulation with staff and further PT assessment next day.   Vitals : 92/47 MAP 56, 101/47 MAP 60    Follow Up Recommendations Home health PT;Supervision/Assistance - 24 hour    Equipment Recommendations  None recommended by PT (pt has equipment)    Recommendations for Other Services       Precautions / Restrictions Precautions Precautions: Fall Restrictions Weight Bearing Restrictions: No      Mobility  Bed Mobility Overal bed mobility: Needs Assistance Bed Mobility: Supine to Sit     Supine to sit: Min assist     General bed mobility comments: Pt required minimal assist (use of therapist hand) to pull to upright, able to reposition and scoot trunk to EOB with VCs  while supported on UEs  Transfers Overall transfer level: Needs assistance Equipment used: Rolling walker (2 wheeled) Transfers: Sit to/from [facility] Sit to Stand: Min guard;+2 safety/equipment Stand pivot transfers: Min guard;+2  safety/equipment       General transfer comment: VCs for safe hand placement and positioning, Cues for safety with descent to chair. Min guard secondary to low BP.  Ambulation/Gait             General Gait Details: not assessed secondary to decreased BP  Stairs            Wheelchair Mobility    Modified Rankin (Stroke Patients Only)       Balance Overall balance assessment: Needs assistance;History of Falls Sitting-balance support: Bilateral upper extremity supported;Feet supported Sitting balance-Leahy Scale: Fair Sitting balance - Comments: patient using BUEs for static support, cues for decreased support, patient with posterior bias Postural control: Posterior lean Standing balance support: Bilateral upper extremity supported;During functional activity Standing balance-Leahy Scale: Fair Standing balance comment: limited static standing secondary to orthostatic                             Pertinent Vitals/Pain Pain Assessment: No/denies pain    Home Living Family/patient expects to be discharged to:: Private residence Living Arrangements: Spouse/significant other Available Help at Discharge: Available 24 hours/day Type of Home: House Home Access: Stairs to enter Entrance Stairs-Rails: Can reach both Entrance Stairs-Number of Steps: 8 Home Layout: Multi-level;Able to live on main level with bedroom/bathroom Home Equipment: Gilford Rile - 2 wheels;Walker - 4 wheels;Cane - quad;Bedside commode;Shower seat - built in;Wheelchair - manual      Prior Function Level of Independence: Independent         Comments: husband assisted with needs     Hand Dominance   Dominant  Hand: Right    Extremity/Trunk Assessment   Upper Extremity Assessment: Defer to OT evaluation           Lower Extremity Assessment: Generalized weakness         Communication   Communication: HOH  Cognition Arousal/Alertness: Awake/alert Behavior During Therapy: WFL  for tasks assessed/performed Overall Cognitive Status: Within Functional Limits for tasks assessed                      General Comments      Exercises        Assessment/Plan    PT Assessment Patient needs continued PT services  PT Diagnosis Difficulty walking;Abnormality of gait;Generalized weakness   PT Problem List Decreased strength;Decreased activity tolerance;Decreased balance;Decreased mobility;Decreased safety awareness;Cardiopulmonary status limiting activity  PT Treatment Interventions DME instruction;Gait training;Stair training;Functional mobility training;Therapeutic activities;Therapeutic exercise;Balance training;Patient/family education   PT Goals (Current goals can be found in the Care Plan section) Acute Rehab PT Goals Patient Stated Goal: to go home with husband assist PT Goal Formulation: With patient/family Time For Goal Achievement: 09/11/14 Potential to Achieve Goals: Good    Frequency Min 4X/week   Barriers to discharge Inaccessible home environment stairs to enter the home    Co-evaluation               End of Session Equipment Utilized During Treatment: Gait belt Activity Tolerance: Patient tolerated treatment well;Other (comment) (orthostatic BP) Patient left: in chair;with call bell/phone within reach;with family/visitor present Nurse Communication: Mobility status         Time: 1610-9604 PT Time Calculation (min): 26 min   Charges:   PT Evaluation $Initial PT Evaluation Tier I: 1 Procedure PT Treatments $Therapeutic Activity: 8-22 mins $Self Care/Home Management: 8-22   PT G CodesDuncan Dull 08/28/2014, 12:18 PM Alben Deeds, [place] DPT  [phone]

## 2014-08-28 NOTE — Progress Notes (Signed)
PT Cancellation Note  Patient Details Name: RICKAYLA WIELAND MRN: [id number] DOB: [birth year]   Cancelled Treatment:    Reason Eval/Treat Not Completed: Patient not medically ready (pt on bedrest, will see as updated)   Duncan Dull 08/28/2014, 7:37 AM Alben Deeds, PT DPT  [phone]

## 2014-08-28 NOTE — Progress Notes (Signed)
Patient ID: Susan [name], female   DOB: [birth year], 80 y.o.   MRN: [id number]   Susan [name] is a 80 y.o. female presented with left hemiparesis, left facial weakness and dysarthria on 9/9 s/p IR bilateral common carotid arteriograms followed complete revascularization of Total occlusion of RT ICA terminus ,RT MCA and RT ACA using 10 mg of superselective IA integrelin and x 1 pass with the Solitaire FR device. Persistent occlusion of of Prox RT ICA despite use of solitaire Fr x2 and the penumbra aspiration system. Rt MCA and RT ACA And RT ICA terminus fill via ACOM from LT ICA promptly. CT head 9/10 with rounded sub cm density in the right sylvian fissure, unclear if this reflects focal subarachnoid blood or enhancing thrombus, with residual surrounding subarachnoid blood. CT head today findings unchanged. Patient is doing well post extubation yesterday and is eager to be discharged.   Allergies: Atorvastatin; Lovastatin; Pravastatin sodium; Prednisone; and Simvastatin  Medications: Prior to Admission medications   Medication Sig Start Date End Date Taking? Authorizing Provider  albuterol (PROVENTIL HFA;VENTOLIN HFA) 108 (90 BASE) MCG/ACT inhaler Inhale 2 puffs into the lungs every 6 (six) hours as needed for wheezing or shortness of breath.   Yes Historical Provider, MD  amoxicillin (AMOXIL) 500 MG capsule Take 500 mg by mouth 3 (three) times daily.    Yes Historical Provider, MD  aspirin 325 MG tablet Take 325 mg by mouth daily.   Yes Historical Provider, MD  midodrine (PROAMATINE) 10 MG tablet Take 10 mg by mouth 4 (four) times daily.   Yes Historical Provider, MD  Rosuvastatin Calcium (CRESTOR PO) Take 1 tablet by mouth daily.   Yes Historical Provider, MD    Review of Systems denies any groin site or abdominal pain. Denies any extremity weakness or speech difficulty.   Vital Signs: BP 120/63  Pulse 84  Temp(Src) 98.8 F (37.1 C) (Oral)  Resp 21  Ht 5\' 4"  (1.626 m)  Wt 190 lb 0.6 oz  (86.2 kg)  BMI 32.60 kg/m2  SpO2 97%  Physical Exam General: Extubated, alert and following commands, speech clear and appropriate.  Abd: Soft, NT, ND  Ext: Bilateral groin sites soft, NT, no signs of bleeding/hematoma, DP 2+ bilaterally  Neuro: Able to open and close eyes bilaterally, no ataxia, smile symmetrical, tongue midline, EOMI, follows commands, equal strength upper and lower extremities.  Imaging: Ct Head Wo Contrast  08/28/2014   CLINICAL DATA:  Follow up stroke with intervention.  EXAM: CT HEAD WITHOUT CONTRAST  TECHNIQUE: Contiguous axial images were obtained from the base of the skull through the vertex without intravenous contrast.  COMPARISON:  CT head August 27, 2014  FINDINGS: Similar 9 mm ovoid density in RIGHT sylvian fissure, with trace residual subarachnoid blood. No intraparenchymal hemorrhage, mass effect or midline shift. The ventricles and sulci are normal for patient's age. Mild white matter changes suggest chronic Janice vessel ischemic disease. No acute large vascular territory infarct.  Basal cisterns are patent. No skull fracture. Mild frothy secretions within sphenoid sinus without air-fluid levels. Susan [name] RIGHT maxillary mucosal retention cyst. Severe temporomandibular osteoarthrosis. Mastoid air cells are well aerated. Status post bilateral ocular lens implants.  IMPRESSION: Similar sub cm rounded density in RIGHT sylvian fissure probable thrombus, possibly intravascular with trace residual subarachnoid blood.  No acute large vascular territory infarct.   Electronically Signed   By: August 29, 2014   On: 08/28/2014 05:38   Ct Head Wo Contrast  08/27/2014   CLINICAL  DATA:  Followup stroke.  EXAM: CT HEAD WITHOUT CONTRAST  TECHNIQUE: Contiguous axial images were obtained from the base of the skull through the vertex without intravenous contrast.  COMPARISON:  CT of the head August 26, 2014  FINDINGS: 8 mm ovoid density in right sylvian fissure, increased from prior  examination with trace surrounding residual subarachnoid blood. No midline shift or mass effect. Mild white matter changes suggest chronic [place]ena vessel ischemic disease. No intraparenchymal hemorrhage. Ventricles and sulci are overall normal for patient's age.  No abnormal extra-axial fluid collections. Trace calcific atherosclerosis of the carotid siphons. No paranasal sinus air-fluid levels. The mastoid air cells are well aerated. Severe bilateral temporomandibular osteoarthrosis.  IMPRESSION: Rounded sub cm density in the right sylvian fissure, is unclear if this reflects focal subarachnoid blood or, enhancing thrombus, with residual surrounding subarachnoid blood. No CT findings of acute large MCA territory infarct.   Electronically Signed   By: Elon Alas   On: 08/27/2014 05:46   Ct Head Wo Contrast  08/26/2014   CLINICAL DATA:  Post thrombectomy, RIGHT middle cerebral artery occlusion, followup  EXAM: CT HEAD WITHOUT CONTRAST  TECHNIQUE: Contiguous axial images were obtained from the base of the skull through the vertex without intravenous contrast.  COMPARISON:  Earlier exam of 08/26/2014  FINDINGS: Mild atrophy.  Stable ventricular morphology.  No midline shift.  Foci of high attenuation are identified along the course of the RIGHT middle cerebral artery within the RIGHT sylvian fissure.  This likely represents residual contrast within the thrombus of the RIGHT middle cerebral artery.  Developing RIGHT insular and suspect RIGHT basal ganglia infarcts.  No definite intracranial hemorrhage.  RIGHT MCA at skullbase is less dense.  LEFT hemisphere is unchanged.  IMPRESSION: Foci of high attenuation along the course of the RIGHT middle cerebral artery within the RIGHT sylvian fissure likely representing residual contrast within thrombus of the RIGHT MCA post angiography.  Developing RIGHT insular and suspect basal ganglia infarcts.  No definite intracranial hemorrhage.   Electronically Signed   By: Lavonia Dana M.D.   On: 08/26/2014 19:09   Ct Head (brain) Wo Contrast  08/26/2014   CLINICAL DATA:  Code stroke.  Left-sided weakness.  EXAM: CT HEAD WITHOUT CONTRAST  TECHNIQUE: Contiguous axial images were obtained from the base of the skull through the vertex without intravenous contrast.  COMPARISON:  06/09/2012  FINDINGS: There is new, asymmetric hyperattenuation of the proximal right MCA. There is question of very subtle, early hypoattenuation in the right MCA territory in the right temporoparietal region. There is no acute intracranial hemorrhage, mass, midline shift, or extra-axial fluid collection. Ventricles and sulci are within normal limits for age. Patchy hypodensities in the deep cerebral white matter bilaterally are nonspecific but compatible with minimal chronic Spr[place]ley vessel ischemic disease, similar to the prior study.  Prior bilateral cataract extraction is noted. Mastoid air cells are clear. Stotts right maxillary sinus mucous retention cyst is partially visualized. Mild degenerative changes are noted at the temporomandibular joints. Mild bilateral carotid siphon and intracranial vertebral artery calcification is noted.  IMPRESSION: 1. New dense right MCA, concerning for acute thrombus. Very early edema is questioned in the right temporoparietal region. 2. No acute intracranial hemorrhage. Critical Value/emergent results were called by telephone at the time of interpretation on 08/26/2014 at 2:55 p.m. to Dr. Aram Beecham, who verbally acknowledged these results.   Electronically Signed   By: Logan Bores   On: 08/26/2014 15:01   Dg Chest Port 1 [address]  08/27/2014   CLINICAL DATA:  Endotracheal tube placement.  EXAM: PORTABLE CHEST - 1 VIEW  COMPARISON:  12/29/2013  FINDINGS: Endotracheal tube was placed with tip measuring 5.4 cm above the carinal. Enteric tube tip is off the field of view but below the left hemidiaphragm. Cardiac pacemaker. Shallow inspiration. The cardiac enlargement with mild central  pulmonary vascular congestion. No edema or consolidation. No pneumothorax.  IMPRESSION: Appliances appear to be in satisfactory location. Shallow inspiration. Cardiac enlargement. Mild pulmonary vascular congestion.   Electronically Signed   By: Lucienne Capers M.D.   On: 08/27/2014 00:00   Ir Angio Intra Extracran Sel Com Carotid Innominate Uni L Mod Sed  08/27/2014   CLINICAL DATA:  Left-sided hemiplegia. Right-sided gaze deviation. Dysarthria. Hyperdense right MCA sign.  EXAM: IR ANGIO INTRA EXTRACRAN SEL COM CAROTID INNOMINATE UNI LEFT MOD SED bilateral common carotid arteriograms followed by endovascular complete revascularization of T occlusion of the right internal carotid artery terminus, the right middle cerebral artery and right anterior cerebral artery using super selective intra-arterial integrelin and on mechanical thrombectomy.  ANESTHESIA/SEDATION: General anesthesia.  MEDICATIONS: As per general anesthesia.  CONTRAST:  110mL OMNIPAQUE IOHEXOL 300 MG/ML  SOLN  PROCEDURE: Following full explanation of the procedure along with the potential assess complications, informed witnessed consent was obtained  The right groin was prepped and draped in the usual sterile fashion. Thereafter using modified Seldinger technique, transfemoral access into right common femoral artery was obtained without difficulty. Over a 0.035 inch guidewire, 5 French Pinnacle sheath was inserted. Through this, and also over a 0.035 inch guidewire, 5 [place] JB 1 catheter was advanced to the aortic arch region and selectively positioned in the right common femoral artery and subsequently the left common carotid artery. Patient tolerated procedure well.  COMPLICATIONS: None immediate  FINDINGS: The right common carotid arteriogram demonstrates right external carotid artery and its major branches to be normally opacified  The right internal carotid artery in its proximal 1/3 demonstrates opacification. Distal to this there is  complete angiographic occlusion with a flank shaped configuration to the distal aspect of the opacification.  Intra parotid there is a no reconstitution from the external carotid artery branches.  The angiographic findings were reviewed with the referring neurologist. The option of endovascular revascularization to prevent further neurological injury, and to potentially improve subsequent neurological outcome was discussed with the patient's spouse. The risks of intracranial hemorrhage of 10%, worsening neurological deficit, ventilator dependency, death were all reviewed with the patient's family. Also the potential for inability to Re vascularized was also reviewed.  After informed consent, the diagnostic JB 1 catheter and right common carotid artery was exchanged over a 0.035 inch 300 cm Rosen exchange guidewire for an 8 French 55 cm right left neurovascular sheath using biplane roadmap technique and constant fluoroscopic guidance. Good aspiration was obtained from the side port of the neurovascular sheath. This was then connected to continuous heparinized saline fusion  Four over the [employer] guidewire, an 8 [place] 9 cm Merci balloon guide catheter which had been prepped and purged with send 5% contrast and 25% heparinized saline infusion was then advanced and I and positioned just proximal to the right common carotid bifurcation. The guidewire was removed. Good aspiration was obtained from the hub of the 8 [place] Merci guide catheter. A gentle constant injection demonstrated no change in the occluded right internal carotid artery. No spasm or filling defects were seen either  It this time over a 0.035 inch roadrunner guidewire, the  8 [place] Merci guide catheter was advanced into the will right internal carotid artery just proximal to the L flank shaped configuration of the occlusion.  A combination of 18 L micro catheter which had been steam shaped in site of playing a DAC 125 catheter over a 0.014 inches  of the central micro guidewire was advanced and just distal to the tip of the 8 [place] Merci guide catheter.  With the micro guidewire leading with Jake tip, FLAIR a shin, the micro guidewire was gently a deflated using a torque device as a combination was then advanced through I a.m. torturous of proximal right internal carotid artery followed by the micro catheter combination.  The combination was advanced to the de the right internal carotid artery supra clinoid segment. Though the wire was then gently manipulated into the right middle cerebral artery inferior division in the M2 catheter region followed by the advanced time of the micro catheter combination  Once the tip of the 18 L a marginally catheter was in the into right anterior region of the inferior branch, the micro guidewire was removed. Good aspiration was obtained from the hub of the 18 L micro catheter. A gentle constant injection demonstrated slow antegrade flow distally.  Approximately 6 mg of super selective intra-arterial anteriorly was then infused over approximately 6 min.  A 4 mm x 40 mm solitaire FR retrieval device was then advanced in a coaxial manner and with constant heparinized saline fusion to the distal and of the 18 18 L micro catheter. The O ring on the delivery micro guidewire and the delivery micro catheter were then released. With slight forward gentle traction with the right hand all on the delivery micro guidewire, with the left hand the delivery micro catheter was then gently retrieved she just proximal to the proximal aspect of the retrieval stent. This then maintained deployed for approximately 3 min  The balloon was then inflated in the right internal carotid artery for proximal flow arrest. The proximal retrieval device was capture into the distal portion of the 18 L micro catheter.  With constant aspiration being applied with a 60 mL syringe at the hub of the 8 [place] Merci guide catheter, the combination of the retrieval  device, the [facility] 125 catheter and the micro catheter were then gently retrieved and removed. Aspiration was continued as the balloon was then deflated in the right internal carotid artery. Back bleed was allowed. A control arteriogram performed through the 8 [place] Merci guide CT demonstrated continued occlusion of the right internal carotid artery in the proximal 1/3. The aspirate over revealed a few chunks of clot presumably from the right middle cerebral artery and possibly the right internal carotid artery terminus.  A second pass was then similarly made with the combination of the 18 L Merci micro catheter the [facility] 125 catheter over a 0.014 inch soft tip synchro micro guidewire. The this time the micro catheter was positioned in the distal right M1 segment. The guidewire was then gently retrieved and removed. Good aspiration obtained from the hub of the 18 L micro catheter.  Another proximal 2 mg of Integrelin was then infused through the micro catheter. The 4 mm x 40 mm solitaire FR retrieval device were then positioned and the full nasal above-mentioned early. This is left deployed for approximately 3 min. A control tram performed and gently demonstrated free antegrade flow through the right middle cerebral artery and right anterior cerebral artery.  The O rings on the delayed micro  guidewire and a delayed micro catheter and released. The proximal portion of the stent was then captured into the distal micro catheter. The balloon was then again a ball I inflated in the proximal right internal carotid artery. With constant aspiration being applied, the combination was then retrieved slowly but surely until it completely of the guide catheter. Significantly chunks of clot were seen in the retrieval device above pack and also in the aspirate.  A control 3 performed through the 8 [place] Merci guide catheter continue demonstrate occluded proximal right internal carotid artery. At this time a third attempt was then  made with the advancement of the combination of a DAC 125 micro catheter with the 18 L Merci guide catheter and positioned in the petrous segment of the right internal carotid artery. Control arterial performed through the [facility] 125 pack catheter demonstrated antegrade flow would affect excellent clearance of the contrast from the supra clinoid at the right MCA and right anterior cerebral artery distribution. This suggested retrograde flow from the posterior communicating artery and also via the anterior communicating artery from the contralateral left internal carotid artery.  However at the continue to be stasis in the more proximal cervical segment in the proximal petrous segment of the right internal carotid artery suggestive of a significant occlusion of the carotid. And solitaire FR 4 x 4 mm retrieval device then again deployed and the petrous segment of the right internal carotid artery. Again with proximal flow arrest, by in constant aspiration, the combinations were then retrieved and removed as described with aspiration continued as the balloon was deflated of the Merci balloon catheter. The aspirate demonstrated acute chunks of clot. However a control arteriogram performed through the 8 [place] guide catheter demonstrated complete occlusion at the proximal right internal carotid artery.  Signed a Penumbra of 3 max micro catheter was advanced over a 0.0141 soft tip transcend micro guidewire to the distal and of the 8 [place] Merci guide catheter. The micro guidewire was then gently manipulated using a torque device advanced through the proximal right internal carotid artery occlusion followed by the micro catheter which was positioned first proximal and and distal to the occlusion. Approximately 2 mg of super selective Integrelin was also infused into this region during this.  Aspiration was then performed using at back in a generated.  Aspiration was continued proximal into the clot and also distal to this  for approximately  Control scans are performed again continue demonstrate occluded right internal carotid artery in this region without antegrade flow. However flow distally. Slow on account proximal occlusion without and any filling defects.  It was felt that this from the initial arteriogram probably represented a dissection which probably was was initial so percent of the large clot burden in the right internal carotid artery at the brain skullbase without embolization of the right middle and right anterior cerebral arteries  The 8 [place] Merci guide catheter was then retrieved and removed. A diagnostic catheter was advanced and and positioned in the left common carotid artery. An arteriogram performed centered over the carotid bifurcation reveal the external carotid artery on the left in its branches to be normally opacified. The right internal carotid artery at the bulb to the brain skullbase opacified normally as well. Wide patency was seen of the petrous cavernous and supra clinoid segments. The left middle and left anterior cerebral cerebral arteries opacified normally into the capillary and venous phases. Also seen was simultaneously or cross filling via the anterior communicating artery of  the right anterior cerebral artery A2 segment and the middle cerebral artery distribution on the right into the capillary and venous phases. No all of filling defects or areas of hyper or perfusion were seen in the right cerebral hemisphere. Retrograde flow was seen in the proximal right internal carotid artery terminus from the left internal carotid artery injection.  The 8 French right left neurovascular sheath was then exchanged over a 0.035 inches J tipped guidewire for a 9 [place] Pinnacle sheath. This this was then connected to continuous heparinized saline fusion. Throughout the procedure the patient's hemodynamic and neurological status remained stable. There was no extravasation of contrast noted throughout the  procedure. Patient then transferred to the CT scanner for postprocedural CT scan of the brain.  IMPRESSION: Status post endovascular complete revascularization of occluded right internal carotid artery terminus, the right middle cerebral artery and right anterior cerebral artery using 2 pass it with the most solitaire FR 4 mm x 40 mm stent retrieval device, and approximately 10 mg of super selective intra-arterial intracranial infusion of Integrelin.  TICI 3 or revascularization of the right middle and the right anterior cerebral artery distributions as described above from left internal carotid artery via the anterior communicating artery as described.  Occluded right internal carotid artery proximally just distal to the bulb most likely related to a dissection with secondary clot propagation as described above.   Electronically Signed   By: Luanne Bras M.D.   On: 08/27/2014 10:44    Labs: Results for orders placed during the hospital encounter of 08/26/14 (from the past 48 hour(s))  PROTIME-INR     Status: None   Collection Time    08/26/14  2:23 PM      Result Value Ref Range   Prothrombin Time 14.5  11.6 - 15.2 seconds   INR 1.13  0.00 - 1.49  APTT     Status: None   Collection Time    08/26/14  2:23 PM      Result Value Ref Range   aPTT 25  24 - 37 seconds  CBC     Status: Abnormal   Collection Time    08/26/14  2:23 PM      Result Value Ref Range   WBC 8.3  4.0 - 10.5 K/uL   RBC 4.03  3.87 - 5.11 MIL/uL   Hemoglobin 11.3 (*) 12.0 - 15.0 g/dL   HCT 35.5 (*) 36.0 - 46.0 %   MCV 88.1  78.0 - 100.0 fL   MCH 28.0  26.0 - 34.0 pg   MCHC 31.8  30.0 - 36.0 g/dL   RDW 14.0  11.5 - 15.5 %   Platelets 207  150 - 400 K/uL  DIFFERENTIAL     Status: None   Collection Time    08/26/14  2:23 PM      Result Value Ref Range   Neutrophils Relative % 76  43 - 77 %   Neutro Abs 6.4  1.7 - 7.7 K/uL   Lymphocytes Relative 12  12 - 46 %   Lymphs Abs 1.0  0.7 - 4.0 K/uL   Monocytes Relative  9  3 - 12 %   Monocytes Absolute 0.8  0.1 - 1.0 K/uL   Eosinophils Relative 2  0 - 5 %   Eosinophils Absolute 0.2  0.0 - 0.7 K/uL   Basophils Relative 1  0 - 1 %   Basophils Absolute 0.0  0.0 - 0.1 K/uL  COMPREHENSIVE METABOLIC PANEL  Status: Abnormal   Collection Time    08/26/14  2:23 PM      Result Value Ref Range   Sodium 138  137 - 147 mEq/L   Potassium 4.0  3.7 - 5.3 mEq/L   Chloride 103  96 - 112 mEq/L   CO2 23  19 - 32 mEq/L   Glucose, Bld 90  70 - 99 mg/dL   BUN 16  6 - 23 mg/dL   Creatinine, Ser 0.85  0.50 - 1.10 mg/dL   Calcium 8.9  8.4 - 10.5 mg/dL   Total Protein 6.4  6.0 - 8.3 g/dL   Albumin 3.3 (*) 3.5 - 5.2 g/dL   AST 53 (*) 0 - 37 U/L   ALT 133 (*) 0 - 35 U/L   Alkaline Phosphatase 75  39 - 117 U/L   Total Bilirubin 0.3  0.3 - 1.2 mg/dL   GFR calc non Af Amer 62 (*) >90 mL/min   GFR calc Af Amer 72 (*) >90 mL/min   Comment: (NOTE)     The eGFR has been calculated using the CKD EPI equation.     This calculation has not been validated in all clinical situations.     eGFR's persistently <90 mL/min signify possible Chronic Kidney     Disease.   Anion gap 12  5 - 15  CBG MONITORING, ED     Status: None   Collection Time    08/26/14  2:36 PM      Result Value Ref Range   Glucose-Capillary 81  70 - 99 mg/dL  I-STAT TROPOININ, ED     Status: None   Collection Time    08/26/14  2:40 PM      Result Value Ref Range   Troponin i, poc 0.01  0.00 - 0.08 ng/mL   Comment 3            Comment: Due to the release kinetics of cTnI,     a negative result within the first hours     of the onset of symptoms does not rule out     myocardial infarction with certainty.     If myocardial infarction is still suspected,     repeat the test at appropriate intervals.  URINE RAPID DRUG SCREEN (HOSP PERFORMED)     Status: None   Collection Time    08/26/14  2:48 PM      Result Value Ref Range   Opiates NONE DETECTED  NONE DETECTED   Cocaine NONE DETECTED  NONE DETECTED    Benzodiazepines NONE DETECTED  NONE DETECTED   Amphetamines NONE DETECTED  NONE DETECTED   Tetrahydrocannabinol NONE DETECTED  NONE DETECTED   Barbiturates NONE DETECTED  NONE DETECTED   Comment:            DRUG SCREEN FOR MEDICAL PURPOSES     ONLY.  IF CONFIRMATION IS NEEDED     FOR ANY PURPOSE, NOTIFY LAB     WITHIN 5 DAYS.                LOWEST DETECTABLE LIMITS     FOR URINE DRUG SCREEN     Drug Class       Cutoff (ng/mL)     Amphetamine      1000     Barbiturate      200     Benzodiazepine   836     Tricyclics       629  Opiates          300     Cocaine          300     THC              50  URINALYSIS, ROUTINE W REFLEX MICROSCOPIC     Status: Abnormal   Collection Time    08/26/14  2:48 PM      Result Value Ref Range   Color, Urine AMBER (*) YELLOW   Comment: BIOCHEMICALS MAY BE AFFECTED BY COLOR   APPearance CLOUDY (*) CLEAR   Specific Gravity, Urine 1.024  1.005 - 1.030   pH 5.0  5.0 - 8.0   Glucose, UA NEGATIVE  NEGATIVE mg/dL   Hgb urine dipstick TRACE (*) NEGATIVE   Bilirubin Urine NEGATIVE  NEGATIVE   Ketones, ur NEGATIVE  NEGATIVE mg/dL   Protein, ur 30 (*) NEGATIVE mg/dL   Urobilinogen, UA 1.0  0.0 - 1.0 mg/dL   Nitrite NEGATIVE  NEGATIVE   Leukocytes, UA NEGATIVE  NEGATIVE  URINE MICROSCOPIC-ADD ON     Status: Abnormal   Collection Time    08/26/14  2:48 PM      Result Value Ref Range   Squamous Epithelial / LPF RARE  RARE   RBC / HPF 0-2  <3 RBC/hpf   Bacteria, UA FEW (*) RARE   Casts HYALINE CASTS (*) NEGATIVE   Comment: GRANULAR CAST   Urine-Other MUCOUS PRESENT    POCT I-STAT 4, (NA,K, GLUC, HGB,HCT)     Status: Abnormal   Collection Time    08/26/14  4:59 PM      Result Value Ref Range   Sodium 140  137 - 147 mEq/L   Potassium 3.7  3.7 - 5.3 mEq/L   Glucose, Bld 111 (*) 70 - 99 mg/dL   HCT 27.0 (*) 36.0 - 46.0 %   Hemoglobin 9.2 (*) 12.0 - 15.0 g/dL  MRSA PCR SCREENING     Status: None   Collection Time    08/26/14  7:49 PM      Result  Value Ref Range   MRSA by PCR NEGATIVE  NEGATIVE   Comment:            The GeneXpert MRSA Assay (FDA     approved for NASAL specimens     only), is one component of a     comprehensive MRSA colonization     surveillance program. It is not     intended to diagnose MRSA     infection nor to guide or     monitor treatment for     MRSA infections.  TRIGLYCERIDES     Status: None   Collection Time    08/26/14  8:51 PM      Result Value Ref Range   Triglycerides 100  <150 mg/dL  TROPONIN I     Status: None   Collection Time    08/26/14 10:00 PM      Result Value Ref Range   Troponin I <0.30  <0.30 ng/mL   Comment:            Due to the release kinetics of cTnI,     a negative result within the first hours     of the onset of symptoms does not rule out     myocardial infarction with certainty.     If myocardial infarction is still suspected,     repeat the test at appropriate intervals.  BLOOD  GAS, ARTERIAL     Status: Abnormal   Collection Time    08/27/14 12:41 AM      Result Value Ref Range   FIO2 0.40     Delivery systems VENTILATOR     Mode PRESSURE REGULATED VOLUME CONTROL     VT 490     Rate 12.0     Peep/cpap 5.0     pH, Arterial 7.400  7.350 - 7.450   pCO2 arterial 35.9  35.0 - 45.0 mmHg   pO2, Arterial 134.0 (*) 80.0 - 100.0 mmHg   Bicarbonate 22.4  20.0 - 24.0 mEq/L   TCO2 23.7  0 - 100 mmol/L   Acid-base deficit 2.1 (*) 0.0 - 2.0 mmol/L   O2 Saturation 97.6     Patient temperature 94.5     Collection site A-LINE     Drawn by [id number]     Sample type ARTERIAL DRAW    HEMOGLOBIN A1C     Status: Abnormal   Collection Time    08/27/14  4:15 AM      Result Value Ref Range   Hemoglobin A1C 6.6 (*) <5.7 %   Comment: (NOTE)                                                                               According to the ADA Clinical Practice Recommendations for 2011, when     HbA1c is used as a screening test:      >=6.5%   Diagnostic of Diabetes Mellitus                (if abnormal result is confirmed)     5.7-6.4%   Increased risk of developing Diabetes Mellitus     References:Diagnosis and Classification of Diabetes Mellitus,Diabetes     ENID,7824,23(NTIRW 1):S62-S69 and Standards of Medical Care in             Diabetes - 2011,Diabetes Care,2011,34 (Suppl 1):S11-S61.   Mean Plasma Glucose 143 (*) <117 mg/dL   Comment: Performed at [place]     Status: Abnormal   Collection Time    08/27/14  4:15 AM      Result Value Ref Range   Cholesterol 71  0 - 200 mg/dL   Triglycerides 99  <150 mg/dL   HDL 29 (*) >39 mg/dL   Total CHOL/HDL Ratio 2.4     VLDL 20  0 - 40 mg/dL   LDL Cholesterol 22  0 - 99 mg/dL   Comment:            Total Cholesterol/HDL:CHD Risk     Coronary Heart Disease Risk Table                         Men   Women      1/2 Average Risk   3.4   3.3      Average Risk       5.0   4.4      2 X Average Risk   9.6   7.1      3 X Average Risk  23.4   11.0  Use the calculated Patient Ratio     above and the CHD Risk Table     to determine the patient's CHD Risk.                ATP III CLASSIFICATION (LDL):      <100     mg/dL   Optimal      100-129  mg/dL   Near or Above                        Optimal      130-159  mg/dL   Borderline      160-189  mg/dL   High      >190     mg/dL   Very High  CBC WITH DIFFERENTIAL     Status: Abnormal   Collection Time    08/27/14  4:15 AM      Result Value Ref Range   WBC 4.9  4.0 - 10.5 K/uL   RBC 3.31 (*) 3.87 - 5.11 MIL/uL   Hemoglobin 9.2 (*) 12.0 - 15.0 g/dL   HCT 28.6 (*) 36.0 - 46.0 %   MCV 86.4  78.0 - 100.0 fL   MCH 27.8  26.0 - 34.0 pg   MCHC 32.2  30.0 - 36.0 g/dL   RDW 14.1  11.5 - 15.5 %   Platelets 187  150 - 400 K/uL   Neutrophils Relative % 75  43 - 77 %   Neutro Abs 3.7  1.7 - 7.7 K/uL   Lymphocytes Relative 12  12 - 46 %   Lymphs Abs 0.6 (*) 0.7 - 4.0 K/uL   Monocytes Relative 9  3 - 12 %   Monocytes Absolute 0.4  0.1 - 1.0 K/uL    Eosinophils Relative 3  0 - 5 %   Eosinophils Absolute 0.1  0.0 - 0.7 K/uL   Basophils Relative 1  0 - 1 %   Basophils Absolute 0.0  0.0 - 0.1 K/uL  BASIC METABOLIC PANEL     Status: Abnormal   Collection Time    08/27/14  4:15 AM      Result Value Ref Range   Sodium 140  137 - 147 mEq/L   Potassium 4.2  3.7 - 5.3 mEq/L   Chloride 108  96 - 112 mEq/L   CO2 22  19 - 32 mEq/L   Glucose, Bld 109 (*) 70 - 99 mg/dL   BUN 13  6 - 23 mg/dL   Creatinine, Ser 0.77  0.50 - 1.10 mg/dL   Calcium 7.6 (*) 8.4 - 10.5 mg/dL   GFR calc non Af Amer 76 (*) >90 mL/min   GFR calc Af Amer 88 (*) >90 mL/min   Comment: (NOTE)     The eGFR has been calculated using the CKD EPI equation.     This calculation has not been validated in all clinical situations.     eGFR's persistently <90 mL/min signify possible Chronic Kidney     Disease.   Anion gap 10  5 - 15  BASIC METABOLIC PANEL     Status: Abnormal   Collection Time    08/28/14  8:27 AM      Result Value Ref Range   Sodium 143  137 - 147 mEq/L   Potassium 3.9  3.7 - 5.3 mEq/L   Chloride 109  96 - 112 mEq/L   CO2 21  19 - 32 mEq/L   Glucose, Bld 82  70 - 99 mg/dL   BUN 11  6 - 23 mg/dL   Creatinine, Ser 0.87  0.50 - 1.10 mg/dL   Calcium 7.3 (*) 8.4 - 10.5 mg/dL   GFR calc non Af Amer 60 (*) >90 mL/min   GFR calc Af Amer 70 (*) >90 mL/min   Comment: (NOTE)     The eGFR has been calculated using the CKD EPI equation.     This calculation has not been validated in all clinical situations.     eGFR's persistently <90 mL/min signify possible Chronic Kidney     Disease.   Anion gap 13  5 - 15  CBC     Status: Abnormal   Collection Time    08/28/14  8:27 AM      Result Value Ref Range   WBC 5.9  4.0 - 10.5 K/uL   RBC 3.44 (*) 3.87 - 5.11 MIL/uL   Hemoglobin 9.6 (*) 12.0 - 15.0 g/dL   HCT 29.9 (*) 36.0 - 46.0 %   MCV 86.9  78.0 - 100.0 fL   MCH 27.9  26.0 - 34.0 pg   MCHC 32.1  30.0 - 36.0 g/dL   RDW 14.4  11.5 - 15.5 %   Platelets 180  150  - 400 K/uL    Assessment and Plan: Left hemiparesis, left facial weakness and dysarthria  Right ICA, MCA, ACA infarct  S/p revascularization 9/9 with persistent occlusion of Prox RT ICA.  Moving all 4 extremities and following commands, extubated 9/10 Repeat CT head today revealed similar findings to previous CT, plan for repeat CT 9/13 and start antiplatelet therapy when Neurology approves.  Plans per Neurology  D/w Dr. Ethel Rana PA-C Interventional Radiology  08/28/14  10:37 AM   I spent a total of 10 minutes face to face in clinical consultation/evaluation, greater than 50% of which was counseling/coordinating care

## 2014-08-28 NOTE — Progress Notes (Signed)
PULMONARY / CRITICAL CARE MEDICINE   Name: Susan [name] MRN: [id number] DOB: 07/02/1929    ADMISSION DATE:  08/26/2014 CONSULTATION DATE:  08/28/2014  REFERRING MD :  Armida Sans  CHIEF COMPLAINT:  CVA s/p IR revascularization  INITIAL PRESENTATION:  80 yo female with Lt hemiparesis from Rt MCA CVA.  Had revascularization for total occlusion of right ICA terminus, right MCA and right ACA.  She returned to the ICU on the vent and PCCM was consulted.  STUDIES:  CT Head 9/9 >>> Rt MCA density concerning for acute thrombus. Echo 9/10 >>> mild LVH, EF 20 to 50%, grade 2 diastolic dysfx  SIGNIFICANT EVENTS: 9/9 - brought to ED as code stroke, went to IR for revascularization, returned to ICU on vent.  SUBJECTIVE:  Throat a little sore.  Denies chest pain or dyspnea.  Tolerating diet.  VITAL SIGNS: Temp:  [97.6 F (36.4 C)-98.8 F (37.1 C)] 98.8 F (37.1 C) (09/11 0800) Pulse Rate:  [49-100] 84 (09/11 0900) Resp:  [14-31] 21 (09/11 0900) BP: (94-124)/(46-63) 120/63 mmHg (09/11 0900) SpO2:  [90 %-100 %] 97 % (09/11 0900) FiO2 (%):  [30 %] 30 % (09/10 1155) INTAKE / OUTPUT: Intake/Output     09/10 0701 - 09/11 0700 09/11 0701 - 09/12 0700   I.V. (mL/kg) 1850.7 (21.5) 75 (0.9)   Total Intake(mL/kg) 1850.7 (21.5) 75 (0.9)   Urine (mL/kg/hr) 1215 (0.6)    Blood     Total Output 1215     Net +635.7 +75          PHYSICAL EXAMINATION: General: no distress, raspy voice Neuro: alert, normal strength, moves all extremities, follows commands HEENT: no sinus tenderness Cardiovascular: regular  Lungs: no wheeze Abdomen: soft, non tender  Musculoskeletal: no edema  Skin: no rashes  LABS:  CBC  Recent Labs Lab 08/26/14 1423 08/26/14 1659 08/27/14 0415 08/28/14 0827  WBC 8.3  --  4.9 5.9  HGB 11.3* 9.2* 9.2* 9.6*  HCT 35.5* 27.0* 28.6* 29.9*  PLT 207  --  187 180   Coag's  Recent Labs Lab 08/26/14 1423  APTT 25  INR 1.13   BMET  Recent Labs Lab 08/26/14 1423  08/26/14 1659 08/27/14 0415 08/28/14 0827  NA 138 140 140 143  K 4.0 3.7 4.2 3.9  CL 103  --  108 109  CO2 23  --  22 21  BUN 16  --  13 11  CREATININE 0.85  --  0.77 0.87  GLUCOSE 90 111* 109* 82   Electrolytes  Recent Labs Lab 08/26/14 1423 08/27/14 0415 08/28/14 0827  CALCIUM 8.9 7.6* 7.3*   ABG  Recent Labs Lab 08/27/14 0041  PHART 7.400  PCO2ART 35.9  PO2ART 134.0*   Liver Enzymes  Recent Labs Lab 08/26/14 1423  AST 53*  ALT 133*  ALKPHOS 75  BILITOT 0.3  ALBUMIN 3.3*   Cardiac Enzymes  Recent Labs Lab 08/26/14 2200  TROPONINI <0.30   Glucose  Recent Labs Lab 08/26/14 1436  GLUCAP 81    Imaging Ct Head Wo Contrast  08/27/2014   CLINICAL DATA:  Followup stroke.  EXAM: CT HEAD WITHOUT CONTRAST  TECHNIQUE: Contiguous axial images were obtained from the base of the skull through the vertex without intravenous contrast.  COMPARISON:  CT of the head August 26, 2014  FINDINGS: 8 mm ovoid density in right sylvian fissure, increased from prior examination with trace surrounding residual subarachnoid blood. No midline shift or mass effect. Mild white matter changes suggest  chronic [place] vessel ischemic disease. No intraparenchymal hemorrhage. Ventricles and sulci are overall normal for patient's age.  No abnormal extra-axial fluid collections. Trace calcific atherosclerosis of the carotid siphons. No paranasal sinus air-fluid levels. The mastoid air cells are well aerated. Severe bilateral temporomandibular osteoarthrosis.  IMPRESSION: Rounded sub cm density in the right sylvian fissure, is unclear if this reflects focal subarachnoid blood or, enhancing thrombus, with residual surrounding subarachnoid blood. No CT findings of acute large MCA territory infarct.   Electronically Signed   By: Elon Alas   On: 08/27/2014 05:46     ASSESSMENT / PLAN:  NEUROLOGIC A:   Acute right MCA infarct s/p IR revascularization. P:   Per  neurology  PULMONARY OETT 9/9 >>> 9/10 A: VDRF in the setting of acute right MCA infarct >> resolved 9/10. P:   Bronchial hygiene Oxygen as needed to keep SpO2 > 92%  CARDIOVASCULAR A:  Hx hypercholesterolemia. Hx A-fib - now in NSR. Systolic/diastolic heart >> likely chronic, but no prior medical records. LBB on EKG 9/9 - ? Old, no prior EKG's on record. Hx of hyperlipidemia. S/p pacemaker. P:  Monitor on telemetry Decision for anti-coagulation per neurology Might need cardiology evaluation to assist with heart failure management >> defer to primary team  RENAL A:   No acute issues. P:   Monitor renal fx, urine outpt, electrolytes  GASTROINTESTINAL A:   Nutrition. P:   Advance diet as tolerated  Protonix for SUP >> likely can d/c soon  HEMATOLOGIC A:   Anemia of critical illness >> no evidence for bleeding. P:  F/u CBC SCD for DVT prevention  INFECTIOUS A:   No evidence of infection. P:   Monitor fever curve / WBC's.  ENDOCRINE A:   No known issues. P:   Monitor glucose on BMP  TODAY'S SUMMARY:  Agree with plan to transfer out of ICU.  Defer decision to consult cardiology for heart failure management to primary team.  PCCM will sign off.  If additional medical assistance needed, then recommend consulting [facility].  Chesley Mires, MD [facility] Pulmonary/Critical Care 08/28/2014, 9:37 AM Pager:  [phone] After 3pm call: [phone]

## 2014-08-28 NOTE — Progress Notes (Addendum)
STROKE TEAM PROGRESS NOTE   HISTORY Susan [name] is an 80 y.o. female with a past medical history significant for HTN, hypercholesterolemia, atrial fibrillation off anticoagulants, s/p pacemaker placement, brought in via EMS as a code stroke due to acute onset of the above stated symptoms.  As per EMS, she was last known normal at 930 am today.  Husband said that he saw her normal at 930 am, spoke to her on the phone at 1145 am " she sound it normal on the phone, but when I came back home she was weak in the left side, left face, and couldn't talk normal".  Patient denies HA, vertigo, double vision, visual disturbances, CP, SOB, or palpitations.  Initial NIHSS 10.  CT brain showed a hyperdense right MCA but no areas of hemorrhage or ischemia.   Date last known well: 08/26/14  Time last known well: 9: 30 am  tPA Given: no, out of the window for IV tpa but patient taken to endovascular intervention  NIHSS: 10   She underwent emergent mechanical embolectomy by Dr Estanislado Pandy S/P bilateral common Carotid arteriograms followed complete revascularization of T occlusion of RT ICA terminus ,RT MCA and RT ACA using 10 mg of superselective IA integrelin and x 1 pass with the Solitaire FR device.  Persistent occlusion of of Prox RT ICA despite use of solitaire Fr x2 and the penumbra aspiration system./  Rt MCA and RT ACA And RT ICA terminus fill via ACOM from LT ICA promptly,. She was admitted to the neuro ICU for further evaluation and treatment.   SUBJECTIVE (INTERVAL HISTORY) Per RN, doing well overnight. Recently evaluated by speech, cleared for dysphagia 3 diet. No acute concerns   OBJECTIVE Temp:  [97.6 F (36.4 C)-98.8 F (37.1 C)] 98.8 F (37.1 C) (09/11 0800) Pulse Rate:  [49-100] 84 (09/11 0800) Cardiac Rhythm:  [-] Atrial paced;Bundle branch block (09/11 0800) Resp:  [14-31] 25 (09/11 0800) BP: (94-124)/(46-63) 124/51 mmHg (09/11 0800) SpO2:  [90 %-100 %] 96 % (09/11 0800) Arterial  Line BP: (131)/(67) 131/67 mmHg (09/10 0900) FiO2 (%):  [30 %] 30 % (09/10 1155)   Recent Labs Lab 08/26/14 1436  GLUCAP 81    Recent Labs Lab 08/26/14 1423 08/26/14 1659 08/27/14 0415  NA 138 140 140  K 4.0 3.7 4.2  CL 103  --  108  CO2 23  --  22  GLUCOSE 90 111* 109*  BUN 16  --  13  CREATININE 0.85  --  0.77  CALCIUM 8.9  --  7.6*    Recent Labs Lab 08/26/14 1423  AST 53*  ALT 133*  ALKPHOS 75  BILITOT 0.3  PROT 6.4  ALBUMIN 3.3*    Recent Labs Lab 08/26/14 1423 08/26/14 1659 08/27/14 0415  WBC 8.3  --  4.9  NEUTROABS 6.4  --  3.7  HGB 11.3* 9.2* 9.2*  HCT 35.5* 27.0* 28.6*  MCV 88.1  --  86.4  PLT 207  --  187    Recent Labs Lab 08/26/14 2200  TROPONINI <0.30    Recent Labs  08/26/14 1423  LABPROT 14.5  INR 1.13    Recent Labs  08/26/14 1448  COLORURINE AMBER*  LABSPEC 1.024  PHURINE 5.0  GLUCOSEU NEGATIVE  HGBUR TRACE*  BILIRUBINUR NEGATIVE  KETONESUR NEGATIVE  PROTEINUR 30*  UROBILINOGEN 1.0  NITRITE NEGATIVE  LEUKOCYTESUR NEGATIVE       Component Value Date/Time   CHOL 71 08/27/2014 0415   TRIG 99 08/27/2014 0415  HDL 29* 08/27/2014 0415   CHOLHDL 2.4 08/27/2014 0415   VLDL 20 08/27/2014 0415   LDLCALC 22 08/27/2014 0415   Lab Results  Component Value Date   HGBA1C 6.6* 08/27/2014      Component Value Date/Time   LABOPIA NONE DETECTED 08/26/2014 1448   COCAINSCRNUR NONE DETECTED 08/26/2014 1448   LABBENZ NONE DETECTED 08/26/2014 1448   AMPHETMU NONE DETECTED 08/26/2014 1448   THCU NONE DETECTED 08/26/2014 1448   LABBARB NONE DETECTED 08/26/2014 1448    No results found for this basename: ETH,  in the last 168 hours  Ct Head Wo Contrast  08/28/2014   CLINICAL DATA:  Follow up stroke with intervention.  EXAM: CT HEAD WITHOUT CONTRAST  TECHNIQUE: Contiguous axial images were obtained from the base of the skull through the vertex without intravenous contrast.  COMPARISON:  CT head August 27, 2014  FINDINGS: Similar 9 mm ovoid  density in RIGHT sylvian fissure, with trace residual subarachnoid blood. No intraparenchymal hemorrhage, mass effect or midline shift. The ventricles and sulci are normal for patient's age. Mild white matter changes suggest chronic Berrian vessel ischemic disease. No acute large vascular territory infarct.  Basal cisterns are patent. No skull fracture. Mild frothy secretions within sphenoid sinus without air-fluid levels. Krehbiel RIGHT maxillary mucosal retention cyst. Severe temporomandibular osteoarthrosis. Mastoid air cells are well aerated. Status post bilateral ocular lens implants.  IMPRESSION: Similar sub cm rounded density in RIGHT sylvian fissure probable thrombus, possibly intravascular with trace residual subarachnoid blood.  No acute large vascular territory infarct.   Electronically Signed   By: Elon Alas   On: 08/28/2014 05:38   Ct Head Wo Contrast  08/27/2014   CLINICAL DATA:  Followup stroke.  EXAM: CT HEAD WITHOUT CONTRAST  TECHNIQUE: Contiguous axial images were obtained from the base of the skull through the vertex without intravenous contrast.  COMPARISON:  CT of the head August 26, 2014  FINDINGS: 8 mm ovoid density in right sylvian fissure, increased from prior examination with trace surrounding residual subarachnoid blood. No midline shift or mass effect. Mild white matter changes suggest chronic Diefendorf vessel ischemic disease. No intraparenchymal hemorrhage. Ventricles and sulci are overall normal for patient's age.  No abnormal extra-axial fluid collections. Trace calcific atherosclerosis of the carotid siphons. No paranasal sinus air-fluid levels. The mastoid air cells are well aerated. Severe bilateral temporomandibular osteoarthrosis.  IMPRESSION: Rounded sub cm density in the right sylvian fissure, is unclear if this reflects focal subarachnoid blood or, enhancing thrombus, with residual surrounding subarachnoid blood. No CT findings of acute large MCA territory infarct.    Electronically Signed   By: Elon Alas   On: 08/27/2014 05:46   Ct Head Wo Contrast  08/26/2014   CLINICAL DATA:  Post thrombectomy, RIGHT middle cerebral artery occlusion, followup  EXAM: CT HEAD WITHOUT CONTRAST  TECHNIQUE: Contiguous axial images were obtained from the base of the skull through the vertex without intravenous contrast.  COMPARISON:  Earlier exam of 08/26/2014  FINDINGS: Mild atrophy.  Stable ventricular morphology.  No midline shift.  Foci of high attenuation are identified along the course of the RIGHT middle cerebral artery within the RIGHT sylvian fissure.  This likely represents residual contrast within the thrombus of the RIGHT middle cerebral artery.  Developing RIGHT insular and suspect RIGHT basal ganglia infarcts.  No definite intracranial hemorrhage.  RIGHT MCA at skullbase is less dense.  LEFT hemisphere is unchanged.  IMPRESSION: Foci of high attenuation along the course of the  RIGHT middle cerebral artery within the RIGHT sylvian fissure likely representing residual contrast within thrombus of the RIGHT MCA post angiography.  Developing RIGHT insular and suspect basal ganglia infarcts.  No definite intracranial hemorrhage.   Electronically Signed   By: Lavonia Dana M.D.   On: 08/26/2014 19:09   Ct Head (brain) Wo Contrast  08/26/2014   CLINICAL DATA:  Code stroke.  Left-sided weakness.  EXAM: CT HEAD WITHOUT CONTRAST  TECHNIQUE: Contiguous axial images were obtained from the base of the skull through the vertex without intravenous contrast.  COMPARISON:  06/09/2012  FINDINGS: There is new, asymmetric hyperattenuation of the proximal right MCA. There is question of very subtle, early hypoattenuation in the right MCA territory in the right temporoparietal region. There is no acute intracranial hemorrhage, mass, midline shift, or extra-axial fluid collection. Ventricles and sulci are within normal limits for age. Patchy hypodensities in the deep cerebral white matter  bilaterally are nonspecific but compatible with minimal chronic Eckles vessel ischemic disease, similar to the prior study.  Prior bilateral cataract extraction is noted. Mastoid air cells are clear. Surratt right maxillary sinus mucous retention cyst is partially visualized. Mild degenerative changes are noted at the temporomandibular joints. Mild bilateral carotid siphon and intracranial vertebral artery calcification is noted.  IMPRESSION: 1. New dense right MCA, concerning for acute thrombus. Very early edema is questioned in the right temporoparietal region. 2. No acute intracranial hemorrhage. Critical Value/emergent results were called by telephone at the time of interpretation on 08/26/2014 at 2:55 p.m. to Dr. Aram Beecham, who verbally acknowledged these results.   Electronically Signed   By: Logan Bores   On: 08/26/2014 15:01   Dg Chest Port 1 View  08/27/2014   CLINICAL DATA:  Endotracheal tube placement.  EXAM: PORTABLE CHEST - 1 VIEW  COMPARISON:  12/29/2013  FINDINGS: Endotracheal tube was placed with tip measuring 5.4 cm above the carinal. Enteric tube tip is off the field of view but below the left hemidiaphragm. Cardiac pacemaker. Shallow inspiration. The cardiac enlargement with mild central pulmonary vascular congestion. No edema or consolidation. No pneumothorax.  IMPRESSION: Appliances appear to be in satisfactory location. Shallow inspiration. Cardiac enlargement. Mild pulmonary vascular congestion.   Electronically Signed   By: Lucienne Capers M.D.   On: 08/27/2014 00:00   Ir Angio Intra Extracran Sel Com Carotid Innominate Uni L Mod Sed  08/27/2014   CLINICAL DATA:  Left-sided hemiplegia. Right-sided gaze deviation. Dysarthria. Hyperdense right MCA sign.  EXAM: IR ANGIO INTRA EXTRACRAN SEL COM CAROTID INNOMINATE UNI LEFT MOD SED bilateral common carotid arteriograms followed by endovascular complete revascularization of T occlusion of the right internal carotid artery terminus, the right middle  cerebral artery and right anterior cerebral artery using super selective intra-arterial integrelin and on mechanical thrombectomy.  ANESTHESIA/SEDATION: General anesthesia.  MEDICATIONS: As per general anesthesia.  CONTRAST:  155mL OMNIPAQUE IOHEXOL 300 MG/ML  SOLN  PROCEDURE: Following full explanation of the procedure along with the potential assess complications, informed witnessed consent was obtained  The right groin was prepped and draped in the usual sterile fashion. Thereafter using modified Seldinger technique, transfemoral access into right common femoral artery was obtained without difficulty. Over a 0.035 inch guidewire, 5 French Pinnacle sheath was inserted. Through this, and also over a 0.035 inch guidewire, 5 [place] JB 1 catheter was advanced to the aortic arch region and selectively positioned in the right common femoral artery and subsequently the left common carotid artery. Patient tolerated procedure well.  COMPLICATIONS: None  immediate  FINDINGS: The right common carotid arteriogram demonstrates right external carotid artery and its major branches to be normally opacified  The right internal carotid artery in its proximal 1/3 demonstrates opacification. Distal to this there is complete angiographic occlusion with a flank shaped configuration to the distal aspect of the opacification.  Intra parotid there is a no reconstitution from the external carotid artery branches.  The angiographic findings were reviewed with the referring neurologist. The option of endovascular revascularization to prevent further neurological injury, and to potentially improve subsequent neurological outcome was discussed with the patient's spouse. The risks of intracranial hemorrhage of 10%, worsening neurological deficit, ventilator dependency, death were all reviewed with the patient's family. Also the potential for inability to Re vascularized was also reviewed.  After informed consent, the diagnostic JB 1 catheter and  right common carotid artery was exchanged over a 0.035 inch 300 cm Rosen exchange guidewire for an 8 French 55 cm right left neurovascular sheath using biplane roadmap technique and constant fluoroscopic guidance. Good aspiration was obtained from the side port of the neurovascular sheath. This was then connected to continuous heparinized saline fusion  Four over the [employer] guidewire, an 8 [place] 9 cm Merci balloon guide catheter which had been prepped and purged with send 5% contrast and 25% heparinized saline infusion was then advanced and I and positioned just proximal to the right common carotid bifurcation. The guidewire was removed. Good aspiration was obtained from the hub of the 8 [place] Merci guide catheter. A gentle constant injection demonstrated no change in the occluded right internal carotid artery. No spasm or filling defects were seen either  It this time over a 0.035 inch roadrunner guidewire, the 8 [place] Merci guide catheter was advanced into the will right internal carotid artery just proximal to the L flank shaped configuration of the occlusion.  A combination of 18 L micro catheter which had been steam shaped in site of playing a DAC 125 catheter over a 0.014 inches of the central micro guidewire was advanced and just distal to the tip of the 8 [place] Merci guide catheter.  With the micro guidewire leading with Jake tip, FLAIR a shin, the micro guidewire was gently a deflated using a torque device as a combination was then advanced through I a.m. torturous of proximal right internal carotid artery followed by the micro catheter combination.  The combination was advanced to the de the right internal carotid artery supra clinoid segment. Though the wire was then gently manipulated into the right middle cerebral artery inferior division in the M2 catheter region followed by the advanced time of the micro catheter combination  Once the tip of the 18 L a marginally catheter was in the into  right anterior region of the inferior branch, the micro guidewire was removed. Good aspiration was obtained from the hub of the 18 L micro catheter. A gentle constant injection demonstrated slow antegrade flow distally.  Approximately 6 mg of super selective intra-arterial anteriorly was then infused over approximately 6 min.  A 4 mm x 40 mm solitaire FR retrieval device was then advanced in a coaxial manner and with constant heparinized saline fusion to the distal and of the 18 18 L micro catheter. The O ring on the delivery micro guidewire and the delivery micro catheter were then released. With slight forward gentle traction with the right hand all on the delivery micro guidewire, with the left hand the delivery micro catheter was then gently retrieved she just  proximal to the proximal aspect of the retrieval stent. This then maintained deployed for approximately 3 min  The balloon was then inflated in the right internal carotid artery for proximal flow arrest. The proximal retrieval device was capture into the distal portion of the 18 L micro catheter.  With constant aspiration being applied with a 60 mL syringe at the hub of the 8 [place] Merci guide catheter, the combination of the retrieval device, the [facility] 125 catheter and the micro catheter were then gently retrieved and removed. Aspiration was continued as the balloon was then deflated in the right internal carotid artery. Back bleed was allowed. A control arteriogram performed through the 8 [place] Merci guide CT demonstrated continued occlusion of the right internal carotid artery in the proximal 1/3. The aspirate over revealed a few chunks of clot presumably from the right middle cerebral artery and possibly the right internal carotid artery terminus.  A second pass was then similarly made with the combination of the 18 L Merci micro catheter the [facility] 125 catheter over a 0.014 inch soft tip synchro micro guidewire. The this time the micro catheter was  positioned in the distal right M1 segment. The guidewire was then gently retrieved and removed. Good aspiration obtained from the hub of the 18 L micro catheter.  Another proximal 2 mg of Integrelin was then infused through the micro catheter. The 4 mm x 40 mm solitaire FR retrieval device were then positioned and the full nasal above-mentioned early. This is left deployed for approximately 3 min. A control tram performed and gently demonstrated free antegrade flow through the right middle cerebral artery and right anterior cerebral artery.  The O rings on the delayed micro guidewire and a delayed micro catheter and released. The proximal portion of the stent was then captured into the distal micro catheter. The balloon was then again a ball I inflated in the proximal right internal carotid artery. With constant aspiration being applied, the combination was then retrieved slowly but surely until it completely of the guide catheter. Significantly chunks of clot were seen in the retrieval device above pack and also in the aspirate.  A control 3 performed through the 8 [place] Merci guide catheter continue demonstrate occluded proximal right internal carotid artery. At this time a third attempt was then made with the advancement of the combination of a DAC 125 micro catheter with the 18 L Merci guide catheter and positioned in the petrous segment of the right internal carotid artery. Control arterial performed through the [facility] 125 pack catheter demonstrated antegrade flow would affect excellent clearance of the contrast from the supra clinoid at the right MCA and right anterior cerebral artery distribution. This suggested retrograde flow from the posterior communicating artery and also via the anterior communicating artery from the contralateral left internal carotid artery.  However at the continue to be stasis in the more proximal cervical segment in the proximal petrous segment of the right internal carotid artery  suggestive of a significant occlusion of the carotid. And solitaire FR 4 x 4 mm retrieval device then again deployed and the petrous segment of the right internal carotid artery. Again with proximal flow arrest, by in constant aspiration, the combinations were then retrieved and removed as described with aspiration continued as the balloon was deflated of the Merci balloon catheter. The aspirate demonstrated acute chunks of clot. However a control arteriogram performed through the 8 [place] guide catheter demonstrated complete occlusion at the proximal right internal carotid artery.  Signed a Penumbra of 3 max micro catheter was advanced over a 0.0141 soft tip transcend micro guidewire to the distal and of the 8 [place] Merci guide catheter. The micro guidewire was then gently manipulated using a torque device advanced through the proximal right internal carotid artery occlusion followed by the micro catheter which was positioned first proximal and and distal to the occlusion. Approximately 2 mg of super selective Integrelin was also infused into this region during this.  Aspiration was then performed using at back in a generated.  Aspiration was continued proximal into the clot and also distal to this for approximately  Control scans are performed again continue demonstrate occluded right internal carotid artery in this region without antegrade flow. However flow distally. Slow on account proximal occlusion without and any filling defects.  It was felt that this from the initial arteriogram probably represented a dissection which probably was was initial so percent of the large clot burden in the right internal carotid artery at the brain skullbase without embolization of the right middle and right anterior cerebral arteries  The 8 [place] Merci guide catheter was then retrieved and removed. A diagnostic catheter was advanced and and positioned in the left common carotid artery. An arteriogram performed centered over  the carotid bifurcation reveal the external carotid artery on the left in its branches to be normally opacified. The right internal carotid artery at the bulb to the brain skullbase opacified normally as well. Wide patency was seen of the petrous cavernous and supra clinoid segments. The left middle and left anterior cerebral cerebral arteries opacified normally into the capillary and venous phases. Also seen was simultaneously or cross filling via the anterior communicating artery of the right anterior cerebral artery A2 segment and the middle cerebral artery distribution on the right into the capillary and venous phases. No all of filling defects or areas of hyper or perfusion were seen in the right cerebral hemisphere. Retrograde flow was seen in the proximal right internal carotid artery terminus from the left internal carotid artery injection.  The 8 French right left neurovascular sheath was then exchanged over a 0.035 inches J tipped guidewire for a 9 [place] Pinnacle sheath. This this was then connected to continuous heparinized saline fusion. Throughout the procedure the patient's hemodynamic and neurological status remained stable. There was no extravasation of contrast noted throughout the procedure. Patient then transferred to the CT scanner for postprocedural CT scan of the brain.  IMPRESSION: Status post endovascular complete revascularization of occluded right internal carotid artery terminus, the right middle cerebral artery and right anterior cerebral artery using 2 pass it with the most solitaire FR 4 mm x 40 mm stent retrieval device, and approximately 10 mg of super selective intra-arterial intracranial infusion of Integrelin.  TICI 3 or revascularization of the right middle and the right anterior cerebral artery distributions as described above from left internal carotid artery via the anterior communicating artery as described.  Occluded right internal carotid artery proximally just distal to  the bulb most likely related to a dissection with secondary clot propagation as described above.   Electronically Signed   By: Luanne Bras M.D.   On: 08/27/2014 10:44     PHYSICAL EXAM Elderly Caucasian lady not in distress. Patient is intubated and has groin arterial sheaths.. Afebrile. Head is nontraumatic. Neck is supple without bruit.  . Cardiac exam no murmur or gallop. Lungs are clear to auscultation. Distal pulses are well felt. Neurological Exam : Awake and interactive.  Extraocular moments are full range without nystagmus. Fundi were not visualized. Vision acuity cannot be reliably tested. Blinks to threat more on the right than the left. Mild left lower facial weakness. Tongue midline. Able to move all 4 extremities against gravity but mild weakness of the left hip and intrinsic hand muscles. Lower extremity exam limited due to bilateral groin sheaths  ASSESSMENT/PLAN  Susan [name] is a 80 y.o. female with history of HTN, hypercholesterolemia, atrial fibrillation off anticoagulants, s/p pacemaker placement. Presenting with dysarthria and left face weakness and left hemiplegia. She  Did not receive IV t-PA  due to delay in arrival outside TPA window. Suspect right MCA infarct secondary to terminal ICA T. occlusion status post mechanical embolectomy with recannulization using 10 mg of superselective IA integrelin and x 1 pass with the Solitaire FR device. With persistent occlusion of the proximal internal carotid artery in the neck. Stroke work up underway.      aspirin prior to admission, now on aspirin 300 mg rectally every day  MRI  Cannot be done as pacer  MRA  Cannot be done as pacer    Carotid Doppler  Not needed as cerebral angiogram done  2D Echo  Completed shows EF 20 to 25%    LDL 22 on crestor 5mg  daily PTA. Currently held for LDL 22  HgbA1c 6.6     SCDs for VTE prophylaxis  Dysphagia diet  Resultant mild left hemiparesis  Therapy needs:   Pending  Will transfer out of ICU today       Disposition: pending    Other Stroke Risk Factors- ,hyperlipidemia, atrial fibrillation. Remote history of breast cancer   9/11 plan: MRI cannot done due to pacemaker. Repeat CT scan of the head AM 9/11 stable with trace residual subarachnoid blood. Cleared for dysphagia diet today. Will change ASA to oral 325mg . Had a long discussion with patient and spouse in regards to need for anticoagulation to lower stroke risk factor in light of history of A fib and EF of 20-25%. They expressed understanding. In light of question of subarachnoid blood on repeat CT will repeat head CT on 9/13. If repeat head CT on 9/13 stable then will consider d/c ASA and start NOAC. Message left with patients cardiologist (Dr Hansel Feinstein [phone]) to update on patient.    Hospital day # [place], DO [facility] [phone]    To contact Stroke Continuity provider, please refer to [email or web address] After hours, contact General Neurology

## 2014-08-28 NOTE — Clinical Documentation Improvement (Signed)
08/28/14 S/p OR with H/H decrease 9/9: 11.3/35.5 to 9.2/27.0. Please address in Notes. Thank you.  Possible Clinical Conditions?   Expected Acute Blood Loss Anemia  Acute Blood Loss Anemia  Acute on chronic blood loss anemia  Chronic blood loss anemia  Precipitous drop in Hematocrit  Other Condition  Thank You, Ezekiel Ina ,RN Clinical Documentation Specialist:  [phone]  [employer]

## 2014-08-29 DIAGNOSIS — I509 Heart failure, unspecified: Secondary | ICD-10-CM

## 2014-08-29 DIAGNOSIS — I951 Orthostatic hypotension: Secondary | ICD-10-CM

## 2014-08-29 DIAGNOSIS — G2581 Restless legs syndrome: Secondary | ICD-10-CM

## 2014-08-29 DIAGNOSIS — E785 Hyperlipidemia, unspecified: Secondary | ICD-10-CM

## 2014-08-29 DIAGNOSIS — I5022 Chronic systolic (congestive) heart failure: Secondary | ICD-10-CM

## 2014-08-29 MED ORDER — FUROSEMIDE 40 MG PO TABS
40.0000 mg | ORAL_TABLET | Freq: Once | ORAL | Status: AC
  Administered 2014-08-29: 40 mg via ORAL
  Filled 2014-08-29: qty 1

## 2014-08-29 MED ORDER — ALBUTEROL SULFATE (2.5 MG/3ML) 0.083% IN NEBU
2.5000 mg | INHALATION_SOLUTION | Freq: Once | RESPIRATORY_TRACT | Status: AC
  Administered 2014-08-29: 2.5 mg via RESPIRATORY_TRACT
  Filled 2014-08-29: qty 3

## 2014-08-29 MED ORDER — ROPINIROLE HCL 0.25 MG PO TABS
0.2500 mg | ORAL_TABLET | Freq: Two times a day (BID) | ORAL | Status: DC
  Administered 2014-08-29 – 2014-08-30 (×3): 0.25 mg via ORAL
  Filled 2014-08-29 (×4): qty 1

## 2014-08-29 MED ORDER — IPRATROPIUM-ALBUTEROL 0.5-2.5 (3) MG/3ML IN SOLN
3.0000 mL | RESPIRATORY_TRACT | Status: DC | PRN
  Administered 2014-08-29 – 2014-08-30 (×2): 3 mL via RESPIRATORY_TRACT
  Filled 2014-08-29 (×2): qty 3

## 2014-08-29 NOTE — Progress Notes (Signed)
STROKE TEAM PROGRESS NOTE   HISTORY Susan [name] is an 80 y.o. female with a past medical history significant for HTN, hypercholesterolemia, atrial fibrillation off anticoagulants, s/p pacemaker placement, brought in via EMS as a code stroke due to acute onset of the above stated symptoms.  As per EMS, she was last known normal at 930 am today.  Husband said that he saw her normal at 930 am, spoke to her on the phone at 1145 am " she sound it normal on the phone, but when I came back home she was weak in the left side, left face, and couldn't talk normal".  Patient denies HA, vertigo, double vision, visual disturbances, CP, SOB, or palpitations.  Initial NIHSS 10.  CT brain showed a hyperdense right MCA but no areas of hemorrhage or ischemia.   Date last known well: 08/26/14  Time last known well: 9: 30 am  tPA Given: no, out of the window for IV tpa but patient taken to endovascular intervention  NIHSS: 10   She underwent emergent mechanical embolectomy by Dr Estanislado Pandy S/P bilateral common Carotid arteriograms followed complete revascularization of T occlusion of RT ICA terminus ,RT MCA and RT ACA using 10 mg of superselective IA integrelin and x 1 pass with the Solitaire FR device.  Persistent occlusion of of Prox RT ICA despite use of solitaire Fr x2 and the penumbra aspiration system./  Rt MCA and RT ACA And RT ICA terminus fill via ACOM from LT ICA promptly,. She was admitted to the neuro ICU for further evaluation and treatment.  SUBJECTIVE (INTERVAL HISTORY) Per RN, no acute issue overnight, but has wheezing and SOB, got albuterol breathing treatment. Feels better. Request breathing treatment again.    OBJECTIVE Temp:  [98.3 F (36.8 C)-99.9 F (37.7 C)] 98.4 F (36.9 C) (09/12 1734) Pulse Rate:  [81-96] 96 (09/12 1734) Cardiac Rhythm:  [-] Normal sinus rhythm;Atrial paced (09/11 2000) Resp:  [18-20] 18 (09/12 1734) BP: (115-130)/(55-62) 130/57 mmHg (09/12 1734) SpO2:  [95 %-97  %] 97 % (09/12 1734)   Recent Labs Lab 08/26/14 1436  GLUCAP 81    Recent Labs Lab 08/26/14 1423 08/26/14 1659 08/27/14 0415 08/28/14 0827  NA 138 140 140 143  K 4.0 3.7 4.2 3.9  CL 103  --  108 109  CO2 23  --  22 21  GLUCOSE 90 111* 109* 82  BUN 16  --  13 11  CREATININE 0.85  --  0.77 0.87  CALCIUM 8.9  --  7.6* 7.3*    Recent Labs Lab 08/26/14 1423  AST 53*  ALT 133*  ALKPHOS 75  BILITOT 0.3  PROT 6.4  ALBUMIN 3.3*    Recent Labs Lab 08/26/14 1423 08/26/14 1659 08/27/14 0415 08/28/14 0827  WBC 8.3  --  4.9 5.9  NEUTROABS 6.4  --  3.7  --   HGB 11.3* 9.2* 9.2* 9.6*  HCT 35.5* 27.0* 28.6* 29.9*  MCV 88.1  --  86.4 86.9  PLT 207  --  187 180    Recent Labs Lab 08/26/14 2200  TROPONINI <0.30   No results found for this basename: LABPROT, INR,  in the last 72 hours No results found for this basename: COLORURINE, APPERANCEUR, LABSPEC, PHURINE, GLUCOSEU, HGBUR, BILIRUBINUR, KETONESUR, PROTEINUR, UROBILINOGEN, NITRITE, LEUKOCYTESUR,  in the last 72 hours     Component Value Date/Time   CHOL 71 08/27/2014 0415   TRIG 99 08/27/2014 0415   HDL 29* 08/27/2014 0415   CHOLHDL 2.4  08/27/2014 0415   VLDL 20 08/27/2014 0415   LDLCALC 22 08/27/2014 0415   Lab Results  Component Value Date   HGBA1C 6.6* 08/27/2014      Component Value Date/Time   LABOPIA NONE DETECTED 08/26/2014 1448   COCAINSCRNUR NONE DETECTED 08/26/2014 1448   LABBENZ NONE DETECTED 08/26/2014 1448   AMPHETMU NONE DETECTED 08/26/2014 1448   THCU NONE DETECTED 08/26/2014 1448   LABBARB NONE DETECTED 08/26/2014 1448    No results found for this basename: ETH,  in the last 168 hours  Ct Head Wo Contrast  08/28/2014  IMPRESSION: Similar sub cm rounded density in RIGHT sylvian fissure probable thrombus, possibly intravascular with trace residual subarachnoid blood.  No acute large vascular territory infarct.      Ct Head Wo Contrast  08/27/2014   IMPRESSION: Rounded sub cm density in the right sylvian  fissure, is unclear if this reflects focal subarachnoid blood or, enhancing thrombus, with residual surrounding subarachnoid blood. No CT findings of acute large MCA territory infarct.      Ct Head Wo Contrast  08/26/2014   IMPRESSION: Foci of high attenuation along the course of the RIGHT middle cerebral artery within the RIGHT sylvian fissure likely representing residual contrast within thrombus of the RIGHT MCA post angiography.  Developing RIGHT insular and suspect basal ganglia infarcts.  No definite intracranial hemorrhage.      Ct Head (brain) Wo Contrast  08/26/2014 IMPRESSION: 1. New dense right MCA, concerning for acute thrombus. Very early edema is questioned in the right temporoparietal region. 2. No acute intracranial hemorrhage.    2D ehco   - Left ventricle: The cavity size was normal. Wall thickness was increased in a pattern of mild LVH. Systolic function was severely reduced. The estimated ejection fraction was in the range of 20% to 25%. Diffuse hypokinesis. Features are consistent with a pseudonormal left ventricular filling pattern, with concomitant abnormal relaxation and increased filling pressure (grade 2 diastolic dysfunction). - Mitral valve: There was mild regurgitation. - Left atrium: The atrium was mildly dilated. - Right atrium: The atrium was mildly dilated. - Pulmonary arteries: Systolic pressure was mildly increased. PA peak pressure: 32 mm Hg (S).   PHYSICAL EXAM Elderly Caucasian lady not in distress. Patient is intubated and has groin arterial sheaths.. Afebrile. Head is nontraumatic. Neck is supple without bruit. Cardiac exam no murmur or gallop. Lungs are clear but wheeze on expiration. Distal pulses are well felt.  Neurological Exam : Awake and interactive. Language fluent and name repeating intact. Extraocular moments are full range without nystagmus. Fundi were not visualized. Visual field testing intact. Facial symmetrical. Tongue midline. Move all  extremities, 5/5. Reflex 1+ throughout and no babinski b/l.  ASSESSMENT/PLAN  Susan [name] is a 80 y.o. female with history of HTN, hypercholesterolemia, atrial fibrillation off anticoagulants without clear reason, s/p pacemaker placement. Presenting with dysarthria and left face weakness and left hemiplegia. She  Did not receive IV t-PA  due to delay in arrival outside TPA window. Suspect right MCA infarct secondary to right ICA occlusion. status post mechanical embolectomy with recannulization using 10 mg of superselective IA integrelin and x 1 pass with the Solitaire FR device. but persistent occlusion of the proximal internal carotid artery in the neck.  To me it is not clear whether the right ICA was chronic occlusion instead of acute occlusion, which make sense that there is persistent right ICA occlusion with intervention. And pt symptoms could be due to hypotension and hypoperfusion  from collaterals (PCOM and ACOM) to the right MCA in the setting of right ICA occlusion. Pt does have several episodes of hypotension during admission and she has midodrin as home medication for orthostatic hypotension.  Stroke - right MCA stroke s/p thrombectomy - likely due to Afib not on [facility]   aspirin prior to admission, now on aspirin 325 mg every day  Not able to start [facility] due to hemorrhagic transforamtion  Repeat CT Monday to evaluate infarct size and consider start [facility]  Angio showed right ICA occlusion, and right side circulation depends on collateral flow  2D Echo  Completed shows EF 20 to 25%  LDL 22 on crestor 5mg  daily PTA. Currently held for LDL 22  HgbA1c 6.6 on the gaol   SCDs and heparin subq for VTE prophylaxis  Dysphagia diet  Therapy needs: home PT  Disposition: likely home  afib not on [facility] - no clear reason to hold off [facility] - will repeat CT on Monday to decide on [facility] - rate controlled so far  HLD - LDL 22 on the goal - hold off statin for now  Orthostatic hypotension -  several episode of low BP during admision - midodrine resumed - continue monitor  CHF - EF 20-25% on echo - d/c IVF - encourage PO - wheezing on exam - lasix one dose - duoneb treatment - follow up with her cardiologist (Dr Hansel Feinstein [phone]) after discharge  Restless leg syndrome - pretty significant symptoms - Requip low dose bid   Hospital day # 3  Rosalin Hawking, MD PhD Stroke Neurology 08/29/2014 6:52 PM     To contact Stroke Continuity provider, please refer to [email or web address] After hours, contact General Neurology

## 2014-08-29 NOTE — Progress Notes (Signed)
Pt complained of being hard to breathe, recently been prescribed albuterol at home, Dr. Doy Mince ordered one time dose. While waiting on dose to be given, placed pt on 2L [place] for comfort. Pt sat 97% with 2L after albuterol tx, and pt requests to keep oxygen on. Will continue to monitor.

## 2014-08-30 DIAGNOSIS — G255 Other chorea: Secondary | ICD-10-CM

## 2014-08-30 MED ORDER — ALBUTEROL SULFATE HFA 108 (90 BASE) MCG/ACT IN AERS: 2.0000 | INHALATION_SPRAY | Freq: Four times a day (QID) | RESPIRATORY_TRACT | Status: DC | PRN

## 2014-08-30 MED ORDER — ACETAMINOPHEN 325 MG PO TABS: 650.0000 mg | ORAL_TABLET | Freq: Four times a day (QID) | ORAL | Status: DC | PRN

## 2014-08-30 MED ORDER — ALBUTEROL SULFATE (2.5 MG/3ML) 0.083% IN NEBU: 2.5000 mg | INHALATION_SOLUTION | Freq: Four times a day (QID) | RESPIRATORY_TRACT | Status: DC | PRN

## 2014-08-30 MED ORDER — ALBUTEROL SULFATE (2.5 MG/3ML) 0.083% IN NEBU
2.5000 mg | INHALATION_SOLUTION | Freq: Four times a day (QID) | RESPIRATORY_TRACT | Status: DC | PRN
  Administered 2014-08-30 – 2014-08-31 (×3): 2.5 mg via RESPIRATORY_TRACT
  Filled 2014-08-30 (×3): qty 3

## 2014-08-30 MED ORDER — LABETALOL HCL 5 MG/ML IV SOLN: 10.0000 mg | INTRAVENOUS | Status: DC | PRN

## 2014-08-30 NOTE — Evaluation (Addendum)
Occupational Therapy Evaluation Patient Details Name: Susan [name] MRN: [id number] DOB: Apr 11, 1929 Today's Date: 08/30/2014    History of Present Illness Pt is an 80 y.o. female presenting s/p hyperdense R MCA with L sided weakness (UE>LE), L facial weakness, and dyarthria. Pt with hx of HTN, aFib, and pacemaker placement 06/2014.  Pt was taken to IR and had revascularization for total occlusion of right ICA terminus, right MCA and right ACA. She returned to the ICU on the vent, extubated 9/10.   Clinical Impression   Pt admitted with above. Pt with chorea like movements during session. Feel pt will benefit from acute OT to increase independence prior to d/c.     Follow Up Recommendations  Supervision/Assistance - 24 hour;Home health OT    Equipment Recommendations  None recommended by OT    Recommendations for Other Services       Precautions / Restrictions Precautions Precautions: Fall Precaution Comments: pt with chorea movements in Lt UE and LE today Restrictions Weight Bearing Restrictions: No      Mobility Bed Mobility               General bed mobility comments: not assessed  Transfers Overall transfer level: Needs assistance  Transfers: Sit to/from Stand Sit to Stand: Min guard;Min assist     General transfer comment: Min guard/Min A to steady.          ADL Overall ADL's : Needs assistance/impaired     Grooming: Wash/dry hands;Wash/dry face;Applying deodorant;Brushing hair;Min guard;Standing   Upper Body Bathing: Sitting;Set up;Supervision/ safety   Lower Body Bathing: Min guard;Sit to/from stand   Upper Body Dressing : Minimal assistance;Sitting   Lower Body Dressing: Minimal assistance;Sit to/from stand   Toilet Transfer: Minimal assistance;Ambulation;BSC           Functional mobility during ADLs: Minimal assistance General ADL Comments: Educated on energy conservation techniques. Educated on deep breathing technique as pt out of  breath during session. Educated on safety tips for home (safe shoewear, sitting for LB ADLs, rugs). Recommended spouse be with pt when she stands for ADLs. Min guard for balance with ADLs.     Vision                     Perception     Praxis      Pertinent Vitals/Pain Pain Assessment: No/denies pain; O2 in 80's during session but unsure of accuracy of reading. Nurse notified. Performed deep breathing technique. Placed on O2 at end of session.     Hand Dominance Right   Extremity/Trunk Assessment Upper Extremity Assessment Upper Extremity Assessment: LUE deficits/detail LUE Deficits / Details: chorea like movements LUE Coordination: decreased fine motor;decreased gross motor   Lower Extremity Assessment Lower Extremity Assessment: Defer to PT evaluation       Communication Communication Communication: HOH   Cognition Arousal/Alertness: Awake/alert Behavior During Therapy: WFL for tasks assessed/performed Overall Cognitive Status: Within Functional Limits for tasks assessed                     General Comments       Exercises       Shoulder Instructions      Home Living Family/patient expects to be discharged to:: Private residence Living Arrangements: Spouse/significant other Available Help at Discharge: Available 24 hours/day Type of Home: House Home Access: Stairs to enter [employer] of Steps: 8 Entrance Stairs-Rails: Can reach both Home Layout: Multi-level;Able to live on main level with bedroom/bathroom  Bathroom Shower/Tub: Tub/shower unit;Walk-in shower   Bathroom Toilet: Standard     Home Equipment: Environmental consultant - 2 wheels;Walker - 4 wheels;Cane - quad;Bedside commode;Shower seat - built in;Wheelchair - manual      Lives With: Spouse    Prior Functioning/Environment Level of Independence: Independent        Comments: husband assisted with needs    OT Diagnosis: Generalized weakness;Other (comment) (decreased  balance/activity tolerance)   OT Problem List: Decreased coordination;Decreased activity tolerance;Decreased strength;Impaired balance (sitting and/or standing);Decreased knowledge of use of DME or AE;Decreased knowledge of precautions;Impaired UE functional use   OT Treatment/Interventions: Self-care/ADL training;Energy conservation;DME and/or AE instruction;Therapeutic activities;Patient/family education;Balance training    OT Goals(Current goals can be found in the care plan section) Acute Rehab OT Goals Patient Stated Goal: not stated OT Goal Formulation: With patient Time For Goal Achievement: 09/06/14 Potential to Achieve Goals: Good ADL Goals Pt Will Perform Lower Body Dressing: with modified independence;sit to/from stand Pt Will Transfer to Toilet: with modified independence;ambulating Pt Will Perform Toileting - Clothing Manipulation and hygiene: with modified independence;sit to/from stand  OT Frequency: Min 2X/week   Barriers to D/C:            Co-evaluation              End of Session Equipment Utilized During Treatment: Gait belt Nurse Communication: Mobility status;Other (comment) (O2)  Activity Tolerance: Patient tolerated treatment well Patient left: in chair;with call bell/phone within reach;with chair alarm set;with family/visitor present   Time: 4627-0350 OT Time Calculation (min): 34 min Charges:  OT General Charges $OT Visit: 1 Procedure OT Evaluation $Initial OT Evaluation Tier I: 1 Procedure OT Treatments $Self Care/Home Management : 8-22 mins G-CodesBenito Mccreedy OTR/L [phone] 08/30/2014, 6:09 PM

## 2014-08-30 NOTE — Progress Notes (Signed)
STROKE TEAM PROGRESS NOTE   HISTORY Susan [name] is an 80 y.o. female with a past medical history significant for HTN, hypercholesterolemia, atrial fibrillation off anticoagulants, s/p pacemaker placement, brought in via EMS as a code stroke due to acute onset of the above stated symptoms.  As per EMS, she was last known normal at 930 am today.  Husband said that he saw her normal at 930 am, spoke to her on the phone at 1145 am " she sound it normal on the phone, but when I came back home she was weak in the left side, left face, and couldn't talk normal".  Patient denies HA, vertigo, double vision, visual disturbances, CP, SOB, or palpitations.  Initial NIHSS 10.  CT brain showed a hyperdense right MCA but no areas of hemorrhage or ischemia.   Date last known well: 08/26/14  Time last known well: 9: 30 am  tPA Given: no, out of the window for IV tpa but patient taken to endovascular intervention  NIHSS: 10   She underwent emergent mechanical embolectomy by Dr Estanislado Pandy S/P bilateral common Carotid arteriograms followed complete revascularization of T occlusion of RT ICA terminus ,RT MCA and RT ACA using 10 mg of superselective IA integrelin and x 1 pass with the Solitaire FR device.  Persistent occlusion of of Prox RT ICA despite use of solitaire Fr x2 and the penumbra aspiration system./  Rt MCA and RT ACA And RT ICA terminus fill via ACOM from LT ICA promptly,. She was admitted to the neuro ICU for further evaluation and treatment.  SUBJECTIVE (INTERVAL HISTORY) Husband at bedside. Pt developed chorea at left side of the body, involving left arm and left leg. Also, she had tardive dyskinesia type movement at face. This acute change likely due to medication she was given since yesterday, the biggest one is requip, and also maybe duoneb. Both of them were discontinued.     OBJECTIVE Temp:  [97.5 F (36.4 C)-99 F (37.2 C)] 98.4 F (36.9 C) (09/13 1847) Pulse Rate:  [80-101] 101 (09/13  1847) Cardiac Rhythm:  [-] Normal sinus rhythm;Atrial paced (09/13 1938) Resp:  [18-20] 20 (09/13 1847) BP: (106-139)/(60-74) 111/60 mmHg (09/13 1847) SpO2:  [96 %-100 %] 100 % (09/13 1847)   Recent Labs Lab 08/26/14 1436  GLUCAP 81    Recent Labs Lab 08/26/14 1423 08/26/14 1659 08/27/14 0415 08/28/14 0827  NA 138 140 140 143  K 4.0 3.7 4.2 3.9  CL 103  --  108 109  CO2 23  --  22 21  GLUCOSE 90 111* 109* 82  BUN 16  --  13 11  CREATININE 0.85  --  0.77 0.87  CALCIUM 8.9  --  7.6* 7.3*    Recent Labs Lab 08/26/14 1423  AST 53*  ALT 133*  ALKPHOS 75  BILITOT 0.3  PROT 6.4  ALBUMIN 3.3*    Recent Labs Lab 08/26/14 1423 08/26/14 1659 08/27/14 0415 08/28/14 0827  WBC 8.3  --  4.9 5.9  NEUTROABS 6.4  --  3.7  --   HGB 11.3* 9.2* 9.2* 9.6*  HCT 35.5* 27.0* 28.6* 29.9*  MCV 88.1  --  86.4 86.9  PLT 207  --  187 180    Recent Labs Lab 08/26/14 2200  TROPONINI <0.30   No results found for this basename: LABPROT, INR,  in the last 72 hours No results found for this basename: COLORURINE, APPERANCEUR, LABSPEC, PHURINE, GLUCOSEU, HGBUR, BILIRUBINUR, KETONESUR, PROTEINUR, UROBILINOGEN, NITRITE, LEUKOCYTESUR,  in  the last 72 hours     Component Value Date/Time   CHOL 71 08/27/2014 0415   TRIG 99 08/27/2014 0415   HDL 29* 08/27/2014 0415   CHOLHDL 2.4 08/27/2014 0415   VLDL 20 08/27/2014 0415   LDLCALC 22 08/27/2014 0415   Lab Results  Component Value Date   HGBA1C 6.6* 08/27/2014      Component Value Date/Time   LABOPIA NONE DETECTED 08/26/2014 1448   COCAINSCRNUR NONE DETECTED 08/26/2014 1448   LABBENZ NONE DETECTED 08/26/2014 1448   AMPHETMU NONE DETECTED 08/26/2014 1448   THCU NONE DETECTED 08/26/2014 1448   LABBARB NONE DETECTED 08/26/2014 1448    No results found for this basename: ETH,  in the last 168 hours  Ct Head Wo Contrast  08/28/2014  IMPRESSION: Similar sub cm rounded density in RIGHT sylvian fissure probable thrombus, possibly intravascular with  trace residual subarachnoid blood.  No acute large vascular territory infarct.      Ct Head Wo Contrast  08/27/2014   IMPRESSION: Rounded sub cm density in the right sylvian fissure, is unclear if this reflects focal subarachnoid blood or, enhancing thrombus, with residual surrounding subarachnoid blood. No CT findings of acute large MCA territory infarct.      Ct Head Wo Contrast  08/26/2014   IMPRESSION: Foci of high attenuation along the course of the RIGHT middle cerebral artery within the RIGHT sylvian fissure likely representing residual contrast within thrombus of the RIGHT MCA post angiography.  Developing RIGHT insular and suspect basal ganglia infarcts.  No definite intracranial hemorrhage.      Ct Head (brain) Wo Contrast  08/26/2014 IMPRESSION: 1. New dense right MCA, concerning for acute thrombus. Very early edema is questioned in the right temporoparietal region. 2. No acute intracranial hemorrhage.    2D ehco   - Left ventricle: The cavity size was normal. Wall thickness was increased in a pattern of mild LVH. Systolic function was severely reduced. The estimated ejection fraction was in the range of 20% to 25%. Diffuse hypokinesis. Features are consistent with a pseudonormal left ventricular filling pattern, with concomitant abnormal relaxation and increased filling pressure (grade 2 diastolic dysfunction). - Mitral valve: There was mild regurgitation. - Left atrium: The atrium was mildly dilated. - Right atrium: The atrium was mildly dilated. - Pulmonary arteries: Systolic pressure was mildly increased. PA peak pressure: 32 mm Hg (S).   PHYSICAL EXAM Elderly Caucasian lady not in distress. Patient is intubated and has groin arterial sheaths.. Afebrile. Head is nontraumatic. Neck is supple without bruit. Cardiac exam no murmur or gallop. Lungs are clear but wheeze on expiration. Distal pulses are well felt.  Neurological Exam : Awake and interactive. Language fluent and  name repeating intact. Extraocular moments are full range without nystagmus. Fundi were not visualized. Visual field testing intact. Facial symmetrical. Tongue midline. Move all extremities, 5/5. Reflex 1+ throughout and no babinski b/l. However, also significant for left arm and leg chorea, constant, but able to stop by will although not lasting long. Peri-mouth tardive dyskinesia like movement.  ASSESSMENT/PLAN  Susan [name] is a 80 y.o. female with history of HTN, hypercholesterolemia, atrial fibrillation off anticoagulants without clear reason, s/p pacemaker placement. Presenting with dysarthria and left face weakness and left hemiplegia. She  Did not receive IV t-PA  due to delay in arrival outside TPA window. Suspect right MCA infarct secondary to right ICA occlusion. status post mechanical embolectomy with recannulization using 10 mg of superselective IA integrelin and x 1  pass with the Solitaire FR device. but persistent occlusion of the proximal internal carotid artery in the neck.  To me it is not clear whether the right ICA was chronic occlusion instead of acute occlusion, which make sense that there is persistent right ICA occlusion with intervention. And pt symptoms could be due to hypotension and hypoperfusion from collaterals (PCOM and ACOM) to the right MCA in the setting of right ICA occlusion. Pt does have several episodes of hypotension during admission and she has midodrin as home medication for orthostatic hypotension.  Stroke - right MCA stroke s/p thrombectomy - likely due to Afib not on AC   aspirin prior to admission, now on aspirin 325 mg every day  Not able to start [facility] due to hemorrhagic transforamtion  Repeat CT tomorrow to evaluate infarct size and consider start [facility]  Angio showed right ICA occlusion, and right side circulation depends on collateral flow  2D Echo  Completed shows EF 20 to 25%  LDL 22 on crestor 5mg  daily PTA. Currently held for LDL 22  HgbA1c 6.6  on the gaol   SCDs and heparin subq for VTE prophylaxis  Dysphagia diet  Therapy needs: home PT/OT  Disposition: likely home  afib not on [facility] - no clear reason to hold off [facility] - will repeat CT on Monday to decide on [facility] - rate controlled so far  HLD - LDL 22 on the goal - hold off statin for now  Orthostatic hypotension - several episode of low BP during admision - midodrine resumed - continue monitor  CHF - EF 20-25% on echo - d/c IVF - encourage PO - still expiratory wheezing on exam - albuterol inhaler - follow up with her cardiologist (Dr Hansel Feinstein [phone]) after discharge  Chorea and tardive dyskinesia - pretty significant symptoms - d/c requip   Hospital day # 4  Rosalin Hawking, MD PhD Stroke Neurology 08/30/2014 9:00 PM     To contact Stroke Continuity provider, please refer to [email or web address] After hours, contact General Neurology

## 2014-08-30 NOTE — Progress Notes (Addendum)
Physical Therapy Treatment Patient Details Name: Susan [name] MRN: [id number] DOB: 07/28/1929 Today's Date: 08/30/2014    History of Present Illness Pt is an 80 y.o. female presenting s/p hyperdense R MCA with L sided weakness (UE>LE), L facial weakness, and dyarthria. Pt with hx of HTN, aFib, and pacemaker placement 06/2014.  Pt was taken to IR and had revascularization for total occlusion of right ICA terminus, right MCA and right ACA. She returned to the ICU on the vent, extubated 9/10.    PT Comments    Pt with new constant chorea like movements in Lt UE and LE today. Pt unsafe to ambulate. Limited to SPT to chair <> BSC only and (A) with peri-care. Pt unable to manage RW today due to movements. Spoke with RN, further workup pending. Will cont to follow and assess need for increased therapies. Pt bp in sitting 119/63; standing 114/76; c/o dizziness after standing ~2 min.  Follow Up Recommendations  Home health PT;Supervision/Assistance - 24 hour (may consider CIR if new movements continue)     Equipment Recommendations  Other (comment) (TBD)    Recommendations for Other Services       Precautions / Restrictions Precautions Precautions: Fall Precaution Comments: pt with chorea movements in Lt UE and LE today Restrictions Weight Bearing Restrictions: No    Mobility  Bed Mobility               General bed mobility comments: not addressed;pt up in receliner and returned  Transfers Overall transfer level: Needs assistance Equipment used: 2 person hand held assist Transfers: Sit to/from [facility] Sit to Stand: Min assist;+2 safety/equipment Stand pivot transfers: Min assist;+2 safety/equipment       General transfer comment: pt unable to use RW today due to involuntary chorea like movements in Lt UE and LT LE; 2 person (A) for safety; performed SPT Recliner <> BSC; max cues for safety; mild dizziness initially with standing  Ambulation/Gait              General Gait Details: unsafe today due to status change and incr chorea movements   Stairs            Wheelchair Mobility    Modified Rankin (Stroke Patients Only) Modified Rankin (Stroke Patients Only) Pre-Morbid Rankin Score: No symptoms Modified Rankin: Moderately severe disability     Balance Overall balance assessment: Needs assistance Sitting-balance support: Feet supported;No upper extremity supported Sitting balance-Leahy Scale: Fair     Standing balance support: During functional activity;Bilateral upper extremity supported Standing balance-Leahy Scale: Poor Standing balance comment: unsteady today due to involuntary movements                    Cognition Arousal/Alertness: Awake/alert Behavior During Therapy: WFL for tasks assessed/performed Overall Cognitive Status: Within Functional Limits for tasks assessed                      Exercises      General Comments General comments (skin integrity, edema, etc.): spoke with RN regarding new involuntary movements; pt planned for CT scan and to r/o seizure like activity      Pertinent Vitals/Pain Pain Assessment: No/denies pain    Home Living                      Prior Function            PT Goals (current goals can now be found in the care  plan section) Acute Rehab PT Goals Patient Stated Goal: to go home with husband assist PT Goal Formulation: With patient Time For Goal Achievement: 09/11/14 Potential to Achieve Goals: Good Progress towards PT goals: Not progressing toward goals - comment (due to chorea like movements)    Frequency  Min 4X/week    PT Plan Current plan remains appropriate    Co-evaluation             End of Session Equipment Utilized During Treatment: Gait belt Activity Tolerance: Treatment limited secondary to medical complications (Comment) (new involuntary chorea like movements) Patient left: in chair;with call bell/phone within  reach;with chair alarm set;with family/visitor present     Time: 4827-0786 PT Time Calculation (min): 24 min  Charges:  $Therapeutic Activity: 23-37 mins                    G CodesGustavus Bryant, [place]  [phone] 08/30/2014, 3:28 PM

## 2014-08-31 ENCOUNTER — Inpatient Hospital Stay (HOSPITAL_COMMUNITY): Payer: Medicare Other

## 2014-08-31 MED ORDER — RISPERIDONE 0.5 MG PO TABS
0.5000 mg | ORAL_TABLET | Freq: Two times a day (BID) | ORAL | Status: DC
  Administered 2014-08-31 – 2014-09-01 (×2): 0.5 mg via ORAL
  Filled 2014-08-31 (×2): qty 1

## 2014-08-31 MED ORDER — FLUDROCORTISONE ACETATE 0.1 MG PO TABS
0.1000 mg | ORAL_TABLET | Freq: Every day | ORAL | Status: DC
  Administered 2014-08-31 – 2014-09-01 (×2): 0.1 mg via ORAL
  Filled 2014-08-31 (×2): qty 1

## 2014-08-31 MED ORDER — ALBUTEROL SULFATE (2.5 MG/3ML) 0.083% IN NEBU
2.5000 mg | INHALATION_SOLUTION | RESPIRATORY_TRACT | Status: DC | PRN
  Administered 2014-08-31 – 2014-09-01 (×4): 2.5 mg via RESPIRATORY_TRACT
  Filled 2014-08-31 (×4): qty 3

## 2014-08-31 MED ORDER — MIDODRINE HCL 5 MG PO TABS
10.0000 mg | ORAL_TABLET | Freq: Four times a day (QID) | ORAL | Status: DC
  Administered 2014-08-31 – 2014-09-01 (×4): 10 mg via ORAL

## 2014-08-31 NOTE — Progress Notes (Signed)
Inpatient Diabetes Program Recommendations  AACE/ADA: New Consensus Statement on Inpatient Glycemic Control (2013)  Target Ranges:  Prepandial:   less than 140 mg/dL      Peak postprandial:   less than 180 mg/dL (1-2 hours)      Critically ill patients:  140 - 180 mg/dL   Please order CBGs TIDHS.  Pt has a history of DM. Thank you  Raoul Pitch BSN, RN,CDE Inpatient Diabetes Coordinator [phone] (team pager)

## 2014-08-31 NOTE — Progress Notes (Signed)
Pt signed up for the [facility] program.

## 2014-08-31 NOTE — Progress Notes (Signed)
STROKE TEAM PROGRESS NOTE   HISTORY Susan [name] is an 80 y.o. female with a past medical history significant for HTN, hypercholesterolemia, atrial fibrillation off anticoagulants, s/p pacemaker placement, brought in via EMS as a code stroke due to acute onset of the above stated symptoms.  As per EMS, she was last known normal at 930 am today.  Husband said that he saw her normal at 930 am, spoke to her on the phone at 1145 am " she sound it normal on the phone, but when I came back home she was weak in the left side, left face, and couldn't talk normal".  Patient denies HA, vertigo, double vision, visual disturbances, CP, SOB, or palpitations.  Initial NIHSS 10.  CT brain showed a hyperdense right MCA but no areas of hemorrhage or ischemia.   Date last known well: 08/26/14  Time last known well: 9: 30 am  tPA Given: no, out of the window for IV tpa but patient taken to endovascular intervention  NIHSS: 10   She underwent emergent mechanical embolectomy by Dr Estanislado Pandy S/P bilateral common Carotid arteriograms followed complete revascularization of T occlusion of RT ICA terminus ,RT MCA and RT ACA using 10 mg of superselective IA integrelin and x 1 pass with the Solitaire FR device.  Persistent occlusion of of Prox RT ICA despite use of solitaire Fr x2 and the penumbra aspiration system./  Rt MCA and RT ACA And RT ICA terminus fill via ACOM from LT ICA promptly,. She was admitted to the neuro ICU for further evaluation and treatment.  SUBJECTIVE (INTERVAL HISTORY) Husband at bedside. 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 Also, she had tardive dyskinesia type movement at face, improved from yesterday. All the new medication was discontined and she is on her home meds now, therefore, the etiology likely to be related to her right side stroke. As I can remember, even before I gave her requip, she had involuntary movement, for which I gave her requip. Seems like  delayed-onset hemichorea s/p stroke.   OBJECTIVE Temp:  [97.8 F (36.6 C)-99 F (37.2 C)] 99 F (37.2 C) (09/14 1400) Pulse Rate:  [60-101] 92 (09/14 1400) Cardiac Rhythm:  [-] Normal sinus rhythm;Atrial paced (09/14 0827) Resp:  [18-20] 18 (09/14 1400) BP: (91-141)/(44-90) 141/75 mmHg (09/14 1400) SpO2:  [93 %-100 %] 98 % (09/14 1400)   Recent Labs Lab 08/26/14 1436  GLUCAP 81    Recent Labs Lab 08/26/14 1423 08/26/14 1659 08/27/14 0415 08/28/14 0827  NA 138 140 140 143  K 4.0 3.7 4.2 3.9  CL 103  --  108 109  CO2 23  --  22 21  GLUCOSE 90 111* 109* 82  BUN 16  --  13 11  CREATININE 0.85  --  0.77 0.87  CALCIUM 8.9  --  7.6* 7.3*    Recent Labs Lab 08/26/14 1423  AST 53*  ALT 133*  ALKPHOS 75  BILITOT 0.3  PROT 6.4  ALBUMIN 3.3*    Recent Labs Lab 08/26/14 1423 08/26/14 1659 08/27/14 0415 08/28/14 0827  WBC 8.3  --  4.9 5.9  NEUTROABS 6.4  --  3.7  --   HGB 11.3* 9.2* 9.2* 9.6*  HCT 35.5* 27.0* 28.6* 29.9*  MCV 88.1  --  86.4 86.9  PLT 207  --  187 180    Recent Labs Lab 08/26/14 2200  TROPONINI <0.30   No results found for this basename: LABPROT, INR,  in  the last 72 hours No results found for this basename: COLORURINE, APPERANCEUR, LABSPEC, [place], GLUCOSEU, HGBUR, BILIRUBINUR, KETONESUR, PROTEINUR, UROBILINOGEN, NITRITE, LEUKOCYTESUR,  in the last 72 hours     Component Value Date/Time   CHOL 71 08/27/2014 0415   TRIG 99 08/27/2014 0415   HDL 29* 08/27/2014 0415   CHOLHDL 2.4 08/27/2014 0415   VLDL 20 08/27/2014 0415   LDLCALC 22 08/27/2014 0415   Lab Results  Component Value Date   HGBA1C 6.6* 08/27/2014      Component Value Date/Time   LABOPIA NONE DETECTED 08/26/2014 1448   COCAINSCRNUR NONE DETECTED 08/26/2014 1448   LABBENZ NONE DETECTED 08/26/2014 1448   AMPHETMU NONE DETECTED 08/26/2014 1448   THCU NONE DETECTED 08/26/2014 1448   LABBARB NONE DETECTED 08/26/2014 1448    No results found for this basename: ETH,  in the last 168  hours  Ct Head Wo Contrast  08/28/2014  IMPRESSION: Similar sub cm rounded density in RIGHT sylvian fissure probable thrombus, possibly intravascular with trace residual subarachnoid blood.  No acute large vascular territory infarct.      Ct Head Wo Contrast  08/27/2014   IMPRESSION: Rounded sub cm density in the right sylvian fissure, is unclear if this reflects focal subarachnoid blood or, enhancing thrombus, with residual surrounding subarachnoid blood. No CT findings of acute large MCA territory infarct.      Ct Head Wo Contrast  08/26/2014   IMPRESSION: Foci of high attenuation along the course of the RIGHT middle cerebral artery within the RIGHT sylvian fissure likely representing residual contrast within thrombus of the RIGHT MCA post angiography.  Developing RIGHT insular and suspect basal ganglia infarcts.  No definite intracranial hemorrhage.      Ct Head (brain) Wo Contrast  08/26/2014 IMPRESSION: 1. New dense right MCA, concerning for acute thrombus. Very early edema is questioned in the right temporoparietal region. 2. No acute intracranial hemorrhage.    08/31/14 - 1. Stable appearance of focal hyperdense focus of the right sylvian  fissure. A portion of this may be parenchymal compatible with a  hemorrhagic infarction or chronic contrast staining. This is not  favored to be intravascular. It is unlikely that the entire density  is subarachnoid either.  2. Better definition of evolving nonhemorrhagic infarct in the right  sylvian fissure as well.  2D ehco   - Left ventricle: The cavity size was normal. Wall thickness was increased in a pattern of mild LVH. Systolic function was severely reduced. The estimated ejection fraction was in the range of 20% to 25%. Diffuse hypokinesis. Features are consistent with a pseudonormal left ventricular filling pattern, with concomitant abnormal relaxation and increased filling pressure (grade 2 diastolic dysfunction). - Mitral valve:  There was mild regurgitation. - Left atrium: The atrium was mildly dilated. - Right atrium: The atrium was mildly dilated. - Pulmonary arteries: Systolic pressure was mildly increased. PA peak pressure: 32 mm Hg (S).   PHYSICAL EXAM Elderly Caucasian lady not in distress. Patient is intubated and has groin arterial sheaths.. Afebrile. Head is nontraumatic. Neck is supple without bruit. Cardiac exam no murmur or gallop. Lungs are clear but wheeze on expiration. Distal pulses are well felt.  Neurological Exam : Awake and interactive. Language fluent and name repeating intact. Extraocular moments are full range without nystagmus. Fundi were not visualized. Visual field testing intact. Facial symmetrical. Tongue midline. Move all extremities, 5/5. Reflex 1+ throughout and no babinski b/l. However, also significant for left arm and leg chorea, constant, but  able to stop by will although not lasting long. Peri-mouth tardive dyskinesia like movement.  ASSESSMENT/PLAN  [name] is a 80 y.o. female with history of HTN, hypercholesterolemia, atrial fibrillation off anticoagulants without clear reason, s/p pacemaker placement. Presenting with dysarthria and left face weakness and left hemiplegia. She  Did not receive IV t-PA  due to delay in arrival outside TPA window. Suspect right MCA infarct secondary to right ICA occlusion. status post mechanical embolectomy with recannulization using 10 mg of superselective IA integrelin and x 1 pass with the Solitaire FR device. but persistent occlusion of the proximal internal carotid artery in the neck.  To me it is not clear whether the right ICA was chronic occlusion instead of acute occlusion, which make sense that there is persistent right ICA occlusion with intervention. And pt symptoms could be due to hypotension and hypoperfusion from collaterals (PCOM and ACOM) to the right MCA in the setting of right ICA occlusion. Pt does have several episodes of  hypotension during admission and she has midodrin as home medication for orthostatic hypotension.  Stroke - right MCA stroke s/p thrombectomy - likely due to Afib not on AC   aspirin prior to admission, now on aspirin 325 mg every day  Not able to start [facility] due to hemorrhagic transforamtion  Repeat CT still showing evolving hemorrhagic infarct with SDH, not feel comfortable to start [facility] at this time. Would like to repeat CT in about 10 days and to follow up in clinic with possible to start [facility] at that time.   Angio showed right ICA occlusion, and right side circulation depends on collateral flow  2D Echo  Completed shows EF 20 to 25%  LDL 22 on crestor 5mg  daily PTA. Currently held for LDL 22  HgbA1c 6.6 on the gaol   SCDs and heparin subq for VTE prophylaxis  Dysphagia diet  Therapy needs: home PT/OT  Disposition: likely home  afib not on AC - no clear reason to hold off [facility] - Repeat CT still showing evolving hemorrhagic infarct with SDH, not feel comfortable to start [facility] at this time. Would like to repeat CT in about 10 days and to follow up in clinic with possible to start [facility] at that time. - rate controlled so far  HLD - LDL 22 on the goal - hold off statin for now  Orthostatic hypotension - several episode of low BP with PT today. supine 130/90; sitting 103/87; standing 90/44. - midodrine qid resumed - add fludrocortison for orthostatic hypotension - TED hose - orthostatic vital daily  CHF - EF 20-25% on echo - d/c IVF - encourage PO - still expiratory wheezing on exam - albuterol inhaler Q4h PRN - follow up with her cardiologist (Dr Hansel Feinstein [phone]) after discharge  Hemichorea and tardive dyskinesia - continue to be significant symptoms - likely related to her recent stroke on the right brain - d/c requip  - EKG to rule out QTc prolongation - tetrabenazine is too expensive for her to use - if Qtc is OK, may need to start seroquel or [facility] day # 5  Rosalin Hawking, MD PhD Stroke Neurology 08/31/2014 5:40 PM     To contact Stroke Continuity provider, please refer to [email or web address] After hours, contact General Neurology

## 2014-08-31 NOTE — Progress Notes (Signed)
UR complete.  Masaye Gatchalian RN, MSN 

## 2014-08-31 NOTE — Care Management Note (Addendum)
  Page 2 of 2   09/01/2014     2:12:08 PM CARE MANAGEMENT NOTE 09/01/2014  Patient:  DEZARAI, PREW   Account Number:  [id number]  Date Initiated:  08/31/2014  Documentation initiated by:  Lorne Skeens  Subjective/Objective Assessment:   Patient was admitted with cerebral thrombus, s/p thrombectomy on 08/26/14. Lives at home with husband     Action/Plan:   Will folow for discharge needs pending PT/OT evals and physician orders.   Anticipated DC Date:  09/01/2014   Anticipated DC Plan:  [place]  CM consult      Choice offered to / List presented to:  C-3 Spouse   DME arranged  [employer]      DME agency  [place] arranged  [place].   Status of service:  In process, will continue to follow Medicare Important Message given?  YES (If response is "NO", the following Medicare IM given date fields will be blank) Date Medicare IM given:  08/31/2014 Medicare IM given by:  Lorne Skeens Date Additional Medicare IM given:   Additional Medicare IM given by:    Discharge Disposition:  [place]  Per UR Regulation:  Reviewed for med. necessity/level of care/duration of stay  If discussed at [place] of Stay Meetings, dates discussed:    Comments:  09/01/14 [place], MSN, CM- Met with patient and husband to discuss home health needs. Patient's husband has chosen [facility] for HHPT/OT. Mary with [facility] was notified and accepted the referral for discharge home today. Address and phone number were verified in the chart. [place] [facility] was notified of need for nebulizer delivery prior to discharge.   08/31/14 [place], MSN, CM- Medicare IM letter provided.

## 2014-08-31 NOTE — Progress Notes (Signed)
MD aware that pt's oxygen was weaned off. MD wants pt's sats to be greater than 92%. Pt's sats at 97% on room air, so pt is on room air at this time.

## 2014-08-31 NOTE — Progress Notes (Signed)
Physical Therapy Treatment Patient Details Name: WEI NEWBROUGH MRN: [id number] DOB: 1929-11-12 Today's Date: 08/31/2014    History of Present Illness Pt is an 80 y.o. female presenting s/p hyperdense R MCA with L sided weakness (UE>LE), L facial weakness, and dyarthria. Pt with hx of HTN, aFib, and pacemaker placement 06/2014.  Pt was taken to IR and had revascularization for total occlusion of right ICA terminus, right MCA and right ACA. She returned to the ICU on the vent, extubated 9/10.    PT Comments    Pt continues to be + for orthostatics; BP in supine 130/90; sitting 103/87; standing 90/44. Pt c/o mild dizziness initially with standing only; stated "it gets better after some time". Pt continues to have chorea like movements in Lt UE and LE. Will cont to follow per POC.   Follow Up Recommendations  Home health PT;Supervision/Assistance - 24 hour     Equipment Recommendations  Other (comment)    Recommendations for Other Services       Precautions / Restrictions Precautions Precautions: Fall Precaution Comments: pt with chorea movements in Lt UE and LE today Restrictions Weight Bearing Restrictions: No    Mobility  Bed Mobility Overal bed mobility: Needs Assistance Bed Mobility: Supine to Sit     Supine to sit: HOB elevated;Min assist     General bed mobility comments: handheld (A) to elevate trunk and use of handrails; cues for safety   Transfers Overall transfer level: Needs assistance Equipment used: 2 person hand held assist Transfers: Sit to/from Stand Sit to Stand: Min assist;+2 safety/equipment         General transfer comment: 2 person (A) for safety; pt unable to use RW today for safety due to Lt UE chorea like movements; cues for safety  Ambulation/Gait Ambulation/Gait assistance: Min assist;+2 safety/equipment Ambulation Distance (Feet): 6 Feet Assistive device: 2 person hand held assist Gait Pattern/deviations: Step-through pattern;Wide base of  support (chorea like movements in Lt UE and LE ) Gait velocity: decreased Gait velocity interpretation: Below normal speed for age/gender General Gait Details: pt ambulated short distance within room to chair due to + orthostatics; pt denied dizziness with ambulation; 2 person for safety due to new chorea movements   Stairs            Wheelchair Mobility    Modified Rankin (Stroke Patients Only) Modified Rankin (Stroke Patients Only) Pre-Morbid Rankin Score: No symptoms Modified Rankin: Moderately severe disability     Balance   Sitting-balance support: Feet supported Sitting balance-Leahy Scale: Fair Sitting balance - Comments: pt denied any dizziness sitting EOB   Standing balance support: During functional activity;Bilateral upper extremity supported Standing balance-Leahy Scale: Poor Standing balance comment: 2 person HHA; pt c/o mild dizziness intially                    Cognition Arousal/Alertness: Awake/alert Behavior During Therapy: WFL for tasks assessed/performed Overall Cognitive Status: Within Functional Limits for tasks assessed                      Exercises      General Comments General comments (skin integrity, edema, etc.): + orthostatics see impression      Pertinent Vitals/Pain Pain Assessment: No/denies pain    Home Living                      Prior Function            PT Goals (current  goals can now be found in the care plan section) Acute Rehab PT Goals Patient Stated Goal: to go home today PT Goal Formulation: With patient Time For Goal Achievement: 09/11/14 Potential to Achieve Goals: Good Progress towards PT goals: Progressing toward goals    Frequency  Min 4X/week    PT Plan Current plan remains appropriate    Co-evaluation             End of Session Equipment Utilized During Treatment: Gait belt Activity Tolerance: Other (comment) (new chorea like movements limiting safety ) Patient left:  in chair;with chair alarm set;with family/visitor present     Time: 4035-2481 PT Time Calculation (min): 15 min  Charges:  $Gait Training: 8-22 mins                    G CodesMelina Modena [place] , [place]  [phone]  08/31/2014, 10:47 AM

## 2014-09-01 ENCOUNTER — Inpatient Hospital Stay (HOSPITAL_COMMUNITY): Payer: Medicare Other

## 2014-09-01 ENCOUNTER — Other Ambulatory Visit: Payer: Self-pay | Admitting: Neurology

## 2014-09-01 DIAGNOSIS — J449 Chronic obstructive pulmonary disease, unspecified: Secondary | ICD-10-CM

## 2014-09-01 DIAGNOSIS — J4489 Other specified chronic obstructive pulmonary disease: Secondary | ICD-10-CM

## 2014-09-01 DIAGNOSIS — E119 Type 2 diabetes mellitus without complications: Secondary | ICD-10-CM

## 2014-09-01 DIAGNOSIS — I5020 Unspecified systolic (congestive) heart failure: Secondary | ICD-10-CM

## 2014-09-01 DIAGNOSIS — I63411 Cerebral infarction due to embolism of right middle cerebral artery: Secondary | ICD-10-CM

## 2014-09-01 DIAGNOSIS — I6340 Cerebral infarction due to embolism of unspecified cerebral artery: Principal | ICD-10-CM

## 2014-09-01 MED ORDER — RISPERIDONE 0.5 MG PO TABS: 0.5000 mg | ORAL_TABLET | Freq: Two times a day (BID) | ORAL | Status: DC

## 2014-09-01 MED ORDER — ALBUTEROL SULFATE (2.5 MG/3ML) 0.083% IN NEBU: 2.5000 mg | INHALATION_SOLUTION | RESPIRATORY_TRACT | Status: DC | PRN

## 2014-09-01 MED ORDER — FLUDROCORTISONE ACETATE 0.1 MG PO TABS: 0.1000 mg | ORAL_TABLET | Freq: Every day | ORAL | Status: DC

## 2014-09-01 NOTE — Discharge Summary (Signed)
Physician Discharge Summary  Patient ID: Susan [name] MRN: [id number] DOB/AGE: Dec 15, 1929 80 y.o.  Admit date: 08/26/2014 Discharge date: 09/01/2014  Admission Diagnoses: stroke  Discharge Diagnoses:  Active Problems:   CVA (cerebral infarction)   CHF (congestive heart failure)   hemichorea   Orthostatic hypotension   COPD with asthma  Discharged Condition: fair  Hospital Course:  H&P - Susan [name] is an 80 y.o. female with a past medical history significant for HTN, hypercholesterolemia, atrial fibrillation off anticoagulants, s/p pacemaker placement, brought in via EMS as a code stroke due to acute onset of the above stated symptoms.  As per EMS, she was last known normal at 930 am today.  Husband said that he saw her normal at 930 am, spoke to her on the phone at 1145 am " she sound it normal on the phone, but when I came back home she was weak in the left side, left face, and couldn't talk normal".  Patient denies HA, vertigo, double vision, visual disturbances, CP, SOB, or palpitations.  Initial NIHSS 10.  CT brain showed a hyperdense right MCA but no areas of hemorrhage or ischemia.  Date last known well: 08/26/14  Time last known well: 9: 30 am  tPA Given: no, out of the window for IV tpa but patient taken to endovascular intervention  NIHSS: 10  She underwent emergent mechanical embolectomy by Dr Estanislado Pandy S/P bilateral common Carotid arteriograms followed complete revascularization of T occlusion of RT ICA terminus ,RT MCA and RT ACA using 10 mg of superselective IA integrelin and x 1 pass with the Solitaire FR device.  Persistent occlusion of of Prox RT ICA despite use of solitaire Fr x2 and the penumbra aspiration system./  Rt MCA and RT ACA And RT ICA terminus fill via ACOM from LT ICA promptly,.  She was admitted to the neuro ICU for further evaluation and treatment.  After admission,  She was admitted to NICU and later transferred to floor. She was put on ASA. Repeat CT  showed right temproal [place] ICH with SDH, so she was not started on any anticoagulation. She will need to repeat CT head and started on NOAC if no blood product persist. In floor developed left hemichorea, was treated with risperdal. Also she has COPD with asthma and was treated with nebulization and inhaler as well as lasix. CXR and lung exam all good. She was dicharged in good condtion, and she will follow up in stroke clinic for repeat CT (ordered) and then decide on starting NOAC.    To me it is not clear whether the right ICA was chronic occlusion instead of acute occlusion, which make sense that there is persistent right ICA occlusion with intervention. And pt symptoms could be due to hypotension and hypoperfusion from collaterals (PCOM and ACOM) to the right MCA in the setting of right ICA occlusion. Pt does have several episodes of hypotension during admission and she has midodrin as home medication for orthostatic hypotension.  Stroke - right MCA stroke s/p thrombectomy - likely due to Afib not on AC  aspirin prior to admission, now on aspirin 325 mg every day  Not able to start [facility] due to hemorrhagic transforamtion  Repeat CT still showing evolving hemorrhagic infarct with SDH, not feel comfortable to start [facility] at this time. Would like to repeat CT in about 10 days and to follow up in clinic with possible to start [facility] at that time.  Angio showed right ICA occlusion, and right  side circulation depends on collateral flow  2D Echo Completed shows EF 20 to 25%  LDL 22 on crestor 5mg  daily PTA. Currently held for LDL 22  HgbA1c 6.6 on the gaol  SCDs and heparin subq for VTE prophylaxis  Dysphagia diet  Therapy needs: home PT/OT  Disposition: likely home afib not on AC  - no clear reason to hold off [facility]  - Repeat CT still showing evolving hemorrhagic infarct with SDH, not feel comfortable to start [facility] at this time. Would like to repeat CT in about 10 days and to follow up in clinic with possible to  start [facility] at that time.  - rate controlled so far  HLD  - LDL 22 on the goal  - hold off statin for now  Orthostatic hypotension  - several episode of low BP with PT today. supine 130/90; sitting 103/87; standing 90/44.  - midodrine qid resumed  - add fludrocortison for orthostatic hypotension  - TED hose  - orthostatic vital daily  CHF  - EF 20-25% on echo  - d/c IVF  - encourage PO  - still expiratory wheezing on exam  - albuterol inhaler Q4h PRN  - follow up with her cardiologist (Dr Hansel Feinstein [phone]) after discharge  Hemichorea and tardive dyskinesia  - continue to be significant symptoms  - likely related to her recent stroke on the right brain  - d/c requip  - EKG to rule out QTc prolongation  - tetrabenazine is too expensive for her to use  - if Qtc is OK, may need to start seroquel or Risperdal.     Consults: rehabilitation medicine  Significant Diagnostic Studies:   Ct Head Wo Contrast  08/28/2014 IMPRESSION: Similar sub cm rounded density in RIGHT sylvian fissure probable thrombus, possibly intravascular with trace residual subarachnoid blood. No acute large vascular territory infarct.  Ct Head Wo Contrast  08/27/2014 IMPRESSION: Rounded sub cm density in the right sylvian fissure, is unclear if this reflects focal subarachnoid blood or, enhancing thrombus, with residual surrounding subarachnoid blood. No CT findings of acute large MCA territory infarct.  Ct Head Wo Contrast  08/26/2014 IMPRESSION: Foci of high attenuation along the course of the RIGHT middle cerebral artery within the RIGHT sylvian fissure likely representing residual contrast within thrombus of the RIGHT MCA post angiography. Developing RIGHT insular and suspect basal ganglia infarcts. No definite intracranial hemorrhage.  Ct Head (brain) Wo Contrast  08/26/2014 IMPRESSION: 1. New dense right MCA, concerning for acute thrombus. Very early edema is questioned in the right temporoparietal region. 2. No  acute intracranial hemorrhage.  08/31/14 - 1. Stable appearance of focal hyperdense focus of the right sylvian  fissure. A portion of this may be parenchymal compatible with a  hemorrhagic infarction or chronic contrast staining. This is not  favored to be intravascular. It is unlikely that the entire density  is subarachnoid either.  2. Better definition of evolving nonhemorrhagic infarct in the right  sylvian fissure as well.  2D ehco  - Left ventricle: The cavity size was normal. Wall thickness was increased in a pattern of mild LVH. Systolic function was severely reduced. The estimated ejection fraction was in the range of 20% to 25%. Diffuse hypokinesis. Features are consistent with a pseudonormal left ventricular filling pattern, with concomitant abnormal relaxation and increased filling pressure (grade 2 diastolic dysfunction). - Mitral valve: There was mild regurgitation. - Left atrium: The atrium was mildly dilated. - Right atrium: The atrium was mildly dilated. - Pulmonary arteries:  Systolic pressure was mildly increased. PA peak pressure: 32 mm Hg (S).   Discharge Exam: Blood pressure 114/53, pulse 85, temperature 97.7 F (36.5 C), temperature source Oral, resp. rate 18, height 5\' 6"  (1.676 m), weight 195 lb 1.7 oz (88.5 kg), SpO2 96.00%. Elderly Caucasian lady not in distress. Patient is intubated and has groin arterial sheaths.. Afebrile. Head is nontraumatic. Neck is supple without bruit. Cardiac exam no murmur or gallop. Lungs are clear but wheeze on expiration. Distal pulses are well felt.  Neurological Exam : Awake and interactive. Language fluent and name repeating intact. Extraocular moments are full range without nystagmus. Fundi were not visualized. Visual field testing intact. Facial symmetrical. Tongue midline. Move all extremities, 5/5. Reflex 1+ throughout and no babinski b/l. However, also significant for left arm and leg chorea, intermittent and disappear on  sleep, but able to stop by will although not lasting long. Peri-mouth tardive dyskinesia like movement.  Disposition: Final discharge disposition not confirmed  Discharge Instructions   Diet - low sodium heart healthy    Complete by:  As directed      Discharge instructions    Complete by:  As directed   You were admitted for stroke, and you had endovascular procedure to reopen the vessel in the brain, your right side carotid remained closed. You developed chorea after the stroke, we put you on the medication called risperdal for treatment. You also has asthma and we controlled it with nebulization. You are discharged today with home PT/OT arrangement as well as home nebulization setup. You will have appointment within 1-2 weeks with stroke clinic to consider stronger blood thinner for your afib and stroke prevention. We will set up the appointment. You also need to follow up with your PCP for hospital followup within one week.     Increase activity slowly    Complete by:  As directed             Medication List         albuterol 108 (90 BASE) MCG/ACT inhaler  Commonly known as:  PROVENTIL HFA;VENTOLIN HFA  Inhale 2 puffs into the lungs every 6 (six) hours as needed for wheezing or shortness of breath.     albuterol (2.5 MG/3ML) 0.083% nebulizer solution  Commonly known as:  PROVENTIL  Take 3 mLs (2.5 mg total) by nebulization every 4 (four) hours as needed for wheezing or shortness of breath.     amoxicillin 500 MG capsule  Commonly known as:  AMOXIL  Take 500 mg by mouth 3 (three) times daily.     aspirin 325 MG tablet  Take 325 mg by mouth daily.     CRESTOR PO  Take 1 tablet by mouth daily.     fludrocortisone 0.1 MG tablet  Commonly known as:  FLORINEF  Take 1 tablet (0.1 mg total) by mouth daily.     midodrine 10 MG tablet  Commonly known as:  PROAMATINE  Take 10 mg by mouth 4 (four) times daily.     risperiDONE 0.5 MG tablet  Commonly known as:  RISPERDAL  Take 1  tablet (0.5 mg total) by mouth 2 (two) times daily.           Follow-up Information   Follow up with Seren Chaloux, MD In 2 weeks. (stroke clinic)    Specialty:  Neurology   Contact information:   [address] [place] [address] [phone]       Signed: Lonya Johannesen 09/01/2014, 2:45 PM

## 2014-09-01 NOTE — Progress Notes (Signed)
Pt discharged at this time with her husband. Alert, verbal with no complaints voiced. No noted distress. Discharge instructions given with verbal understanding.

## 2014-09-01 NOTE — Progress Notes (Signed)
Physical Therapy Treatment Patient Details Name: Susan [name] MRN: [id number] DOB: 1929-03-18 Today's Date: 09/01/2014    History of Present Illness Pt is an 80 y.o. female presenting s/p hyperdense R MCA with L sided weakness (UE>LE), L facial weakness, and dyarthria. Pt with hx of HTN, aFib, and pacemaker placement 06/2014.  Pt was taken to IR and had revascularization for total occlusion of right ICA terminus, right MCA and right ACA. She returned to the ICU on the vent, extubated 9/10.    PT Comments    Pt is progressing well with her mobility.  I had her husband participate in assisting her today to make sure he knew how much physical assist she would require and I educated them both on safety and that any time she is up on her feet she needs to have him assisting her, even in the shower, even if she is just going 5' across the room.  They both verbalized understanding and are hopeful for [facility] services at discharge.    Follow Up Recommendations  Home health PT;Supervision/Assistance - 24 hour     Equipment Recommendations  None recommended by PT    Recommendations for Other Services   NA     Precautions / Restrictions Precautions Precautions: Fall Precaution Comments: pt with chorea movements in Lt UE and LE today and has some awareness and impulsivity    Mobility  Transfers Overall transfer level: Needs assistance Equipment used: 1 person hand held assist Transfers: Sit to/from Stand Sit to Stand: Min assist         General transfer comment: Min assist to support trunk for balance during transitions.  Hand held assist needs to be provided on the right side.  Stood two times from the recliner chair.  Husband provided assist x1 to get an idea of how to help the patient and how much assist she will need at home.   Ambulation/Gait Ambulation/Gait assistance: Min assist;+2 safety/equipment (chair to follow for safety due to significant h/o syncope) Ambulation Distance (Feet):  60 Feet Assistive device: 1 person hand held assist Gait Pattern/deviations: Step-through pattern;Staggering left;Staggering right;Ataxic Gait velocity: too fast to be safe.  Pt gets ahead of herself and is unaware she is off balance and then ends up leaning on a wall table, piece of furniture.     General Gait Details: Pt ambulated with both the therapist's assist x 1 and then her husband's assist x1 with therapist close by for safety.  Chair to follow as well as pt has a limited gait distance at baseline due to syncope.         Modified Rankin (Stroke Patients Only) Modified Rankin (Stroke Patients Only) Pre-Morbid Rankin Score: No symptoms Modified Rankin: Moderately severe disability     Balance Overall balance assessment: Needs assistance Sitting-balance support: Feet supported;No upper extremity supported Sitting balance-Leahy Scale: Good     Standing balance support: Single extremity supported Standing balance-Leahy Scale: Poor Standing balance comment: needs external assist for balance.                     Cognition Arousal/Alertness: Awake/alert Behavior During Therapy: Impulsive Overall Cognitive Status: Impaired/Different from baseline Area of Impairment: Awareness;Safety/judgement         Safety/Judgement: Decreased awareness of safety;Decreased awareness of deficits Awareness: Emergent   General Comments: Pt does not see why her husband has to be there to walk with her, she is quick to move and does not respond to cues to slow down.  She is unsteady on her feet and is unaware of the abnormal movements happening in her left leg (she is aware of her left arm).        General Comments General comments (skin integrity, edema, etc.): I spoke at length with the husband and the patient re: safety at home.  He needs to be helping her (hands on like he did during today's session) whenever she is up on her feet.  I also told her that she has to let him know that  she needs to get up and then wait for him to get there to assist her with her on feet mobility.  Both people involved verbalized understanding.  I discussed the option of inpatient rehab and answered questions about CIR vs home.  They continue to want to pursue home with [facility] services which I think is safe as long as they both understand she needs assist at all times. He is very physically able to assist her at her current level.        Pertinent Vitals/Pain Pain Assessment: No/denies pain           PT Goals (current goals can now be found in the care plan section) Acute Rehab PT Goals Patient Stated Goal: to go home today Progress towards PT goals: Progressing toward goals    Frequency  Min 4X/week    PT Plan Current plan remains appropriate       End of Session Equipment Utilized During Treatment: Gait belt Activity Tolerance: Patient tolerated treatment well Patient left: in chair;with call bell/phone within reach;with family/visitor present;with chair alarm set     Time: 3734-2876 PT Time Calculation (min): 30 min  Charges:  $Gait Training: 8-22 mins $Self Care/Home Management: 8-22                      Nioka Thorington B. [place], [place], DPT [phone]   09/01/2014, 1:48 PM

## 2014-09-01 NOTE — Discharge Instructions (Signed)
Discharge Instructions   Diet - low sodium heart healthy    Complete by:  As directed      Discharge instructions    Complete by:  As directed   You were admitted for stroke, and you had endovascular procedure to reopen the vessel in the brain, your right side carotid remained closed. You developed chorea after the stroke, we put you on the medication called risperdal for treatment. You also has asthma and we controlled it with nebulization. You are discharged today with home PT/OT arrangement as well as home nebulization setup. You will have appointment within 1-2 weeks with stroke clinic to consider stronger blood thinner for your afib and stroke prevention. We will set up the appointment. You also need to follow up with your PCP for hospital followup within one week.We also ordered CT head repeat on 09/10/14 to evaluate the hemorrhage and to decide on your stronger blood thinner.     Increase activity slowly    Complete by:  As directed          We also ordered CT head repeat on 09/10/14 to evaluate the hemorrhage and to decide on your stronger blood thinner.

## 2014-09-02 ENCOUNTER — Telehealth: Payer: Self-pay | Admitting: *Deleted

## 2014-09-02 NOTE — Telephone Encounter (Signed)
Patient was called and gave a appointment for 09/16/2014 @ 1 patient showed understanding.

## 2014-09-02 NOTE — Telephone Encounter (Signed)
Message copied by Joellen Jersey on Wed Sep 02, 2014  1:02 PM ------      Message from: Rosalin Hawking      Created: Tue Sep 01, 2014  2:38 PM       Hi, Moe:            Could you please setup an appointment with this pt to be seen within 2 weeks either by me or Dr. Leonie Man regarding re-start anticoagulation? Thanks for your help.            Rosalin Hawking, MD PhD      Stroke Neurology      09/01/2014      2:39 PM             ------

## 2014-09-03 ENCOUNTER — Other Ambulatory Visit (HOSPITAL_COMMUNITY): Payer: Self-pay | Admitting: Interventional Radiology

## 2014-09-03 ENCOUNTER — Telehealth: Payer: Self-pay | Admitting: *Deleted

## 2014-09-03 DIAGNOSIS — I639 Cerebral infarction, unspecified: Secondary | ICD-10-CM

## 2014-09-03 NOTE — Progress Notes (Signed)
Patient ID: Susan [name], female   DOB: 08/04/1929, 80 y.o.   MRN: [id number]   Pt suffered CVA 08/26/14 Underwent Rt ICA/MCA and ACA revascularization with Integrilin and clot retrieval with Dr Estanislado Pandy that day. Did well Discharged home per Dr Erlinda Hong on 09/01/14 Pt now at home Doing well Still with weakness if L arm and leg--but better daily Physical therapy at house today for first evaluation  She feels speech is still raspy- poss from intubation Feels as if she still tired and has minimal weakness  No other complaints No skin changes or hair loss noted  Pt will hear from scheduler to arrange 2 week follow up with Dr Estanislado Pandy Reported to Dr Estanislado Pandy via phone--he is aware of status

## 2014-09-03 NOTE — Telephone Encounter (Signed)
Called pt spoke with husband to get her set-up for TCM appt. Husband states wife does not see DR. John anymore. Pt pcp is Dr. Regino Schultze in [place]. Inform husband will update her records...Susan [name]

## 2014-09-07 ENCOUNTER — Telehealth: Payer: Self-pay | Admitting: Neurology

## 2014-09-07 NOTE — Telephone Encounter (Signed)
I called and spoke to [place] with [facility].  Fax [phone] re: this pt and her respiratory status.  Asking if can get [facility]  RN nursing to home for pt to monitor respiratory issues?   PT/OT will be delayed by one week, due to respiratory.  May have seen pcp today.

## 2014-09-07 NOTE — Telephone Encounter (Signed)
Tharon Aquas, a nurse from [facility], calling to get an order for a nurse, patient was admitted for therapy today but is having trouble breathing. Please return call and advise.

## 2014-09-07 NOTE — Telephone Encounter (Signed)
Patient's husband called to say she was out of her inhaler from the hospital.  I relayed that he should call her PCP for follow up and refill on the inhaler, asthma.  He verbalized understanding.

## 2014-09-08 ENCOUNTER — Other Ambulatory Visit: Payer: Self-pay | Admitting: Neurology

## 2014-09-08 ENCOUNTER — Telehealth: Payer: Self-pay | Admitting: Neurology

## 2014-09-08 DIAGNOSIS — I63411 Cerebral infarction due to embolism of right middle cerebral artery: Secondary | ICD-10-CM

## 2014-09-08 NOTE — Telephone Encounter (Signed)
tallked with pt over the phone this evening. She stated that she went to see her PCP and got more inhalers to help her breathing and she is doing better. She will go to see her cardiologist tomorrow and her PCP again on Friday.   I also called in for her to have repeat CT done on 9/24 at [place]. She also has appointment with Dr. Estanislado Pandy on 9/28 and Dr. Leonie Man on 9/30.  She stated that her hemichorea on the left is resolved and she still has left sided hemiparesis with weakness and dexterity deficit. Her home PT/OT was delayed for a week due to respiratory issue.  She will see Dr. Leonie Man on 9/30 to decide whether she could be put on anticoagulation for her afib.  Rosalin Hawking, MD PhD Stroke Neurology 09/08/2014 6:58 PM

## 2014-09-08 NOTE — Telephone Encounter (Signed)
Romie Minus, physical therapist from [facility] calling to state that patient cancelled her appointment today due to shortness of breath, Romie Minus says she will try to see her on Thursday. If questions, please call.

## 2014-09-09 NOTE — Telephone Encounter (Signed)
Dr. Erlinda Hong please see below message FYI

## 2014-09-10 ENCOUNTER — Ambulatory Visit (HOSPITAL_COMMUNITY)
Admission: RE | Admit: 2014-09-10 | Discharge: 2014-09-10 | Disposition: A | Discharge status: Home / Self Care | Payer: Medicare Other | Source: Ambulatory Visit | Attending: Neurology | Admitting: Neurology

## 2014-09-10 DIAGNOSIS — I69959 Hemiplegia and hemiparesis following unspecified cerebrovascular disease affecting unspecified side: Secondary | ICD-10-CM | POA: Diagnosis not present

## 2014-09-10 DIAGNOSIS — I6340 Cerebral infarction due to embolism of unspecified cerebral artery: Secondary | ICD-10-CM | POA: Diagnosis not present

## 2014-09-10 DIAGNOSIS — I63411 Cerebral infarction due to embolism of right middle cerebral artery: Secondary | ICD-10-CM

## 2014-09-11 ENCOUNTER — Telehealth: Payer: Self-pay | Admitting: Neurology

## 2014-09-11 NOTE — Telephone Encounter (Signed)
Opal Sidles the PT from [facility] called to relay that the patient refused her visit on 09-10-14, but she is going to try and see her this afternoon.  Patient has been having some asthma issues.

## 2014-09-11 NOTE — Telephone Encounter (Signed)
[name] w/[facility] calling to advise patient denied physical therapy today.

## 2014-09-11 NOTE — Telephone Encounter (Signed)
FYI below.

## 2014-09-14 ENCOUNTER — Ambulatory Visit (HOSPITAL_COMMUNITY)
Admission: RE | Admit: 2014-09-14 | Discharge: 2014-09-14 | Disposition: A | Discharge status: Home / Self Care | Payer: Medicare Other | Source: Ambulatory Visit | Attending: Interventional Radiology | Admitting: Interventional Radiology

## 2014-09-14 DIAGNOSIS — I639 Cerebral infarction, unspecified: Secondary | ICD-10-CM

## 2014-09-16 ENCOUNTER — Encounter: Payer: Self-pay | Admitting: Neurology

## 2014-09-16 ENCOUNTER — Ambulatory Visit (INDEPENDENT_AMBULATORY_CARE_PROVIDER_SITE_OTHER): Payer: Medicare Other | Admitting: Neurology

## 2014-09-16 VITALS — BP 73/49 | HR 90 | Wt 181.0 lb

## 2014-09-16 DIAGNOSIS — I480 Paroxysmal atrial fibrillation: Secondary | ICD-10-CM

## 2014-09-16 DIAGNOSIS — I4891 Unspecified atrial fibrillation: Secondary | ICD-10-CM

## 2014-09-16 DIAGNOSIS — I6350 Cerebral infarction due to unspecified occlusion or stenosis of unspecified cerebral artery: Secondary | ICD-10-CM

## 2014-09-16 MED ORDER — RIVAROXABAN 20 MG PO TABS: 20.0000 mg | ORAL_TABLET | Freq: Every day | ORAL | Status: DC

## 2014-09-16 NOTE — Progress Notes (Signed)
[facility] [address] [place]. [place] [address] [phone]       OFFICE FOLLOW-UP NOTE  [name] Date of Birth:  27-Jun-1929 Medical Record Number:  [id number]   HPI: 19 year Caucasian lady with significant past medical history of hypertension, hyperlipidemia, atrial fibrillation not on anticoagulation, pacemaker placement, remote chemotherapy and autonomic neuropathy with orthostatic dizziness. She was admitted on 08/26/14 to [facility] with sudden onset of left hemiplegia and difficulty talking. Initial NIH stroke scale was 10 on admission. CT scan showed a hyperdense right middle cerebral artery sign without areas of hemorrhage or ischemia. Patient doesn't get outside the IV TPA window but was taken for urgent endovascular intervention. She was found to have occlusion of the right internal carotid artery. After taking written informed consent from the patient's husband and Risk benefit patient underwent complete revascularization of the T. occlusion of the terminal right ICA, right middle and right anterior cerebral artery using one pass of Solitaire FR device and 10 mg of superselective intra-arterial Integrilin through the left internal carotid artery approach with persistent occlusion of the proximal right ICA despite attempted revascularization with solitaire and penumbra devices.. The patient clinically did quite well. Postprocedure CT scan showed a Seher area of  Subdural and temporal hemorrhage versus dye extravasation however subsequent CT scan showed stable appearance. Patient is clinically quite well and was extubated. Hospital course was complicated by an exacerbation of asthma requiring treatment with inhalers and nebulizers. Transthoracic echo showed poor ejection fraction of 20-25% but without definite clot. LDL cholesterol was 22 on Crestor 5 mg daily prior to admission. Hemoglobin A1c was 6.6. Patient was seen by physical and occupational therapy  and felt to be able to discharge home. She has done well at home and is walking with a walker. She has not yet started home physical therapy but plans to do so in the next few days. 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 The involuntaryd movements are much less but are still present. She has history of chronic orthostatic hypotension and dizziness due to presumed autonomic neuropathy related to chemotherapy in the past for cancer. She takes Florinef and midodrine which seemed to help her.  ROS:   14 system review of systems is positive for dizziness, passing out, syncope, gait difficulty, weakness, involving the movements and all other systems negative  PMH:  Past Medical History  Diagnosis Date  . Hypercholesteremia   . A-fib   . Pacemaker   . Low blood pressure   . Cancer     breast    Social History:  History   Social History  . Marital Status: Married    Spouse Name: N/A    Number of Children: 2  . Years of Education: college   Occupational History  . retired    Social History Main Topics  . Smoking status: Never Smoker   . Smokeless tobacco: Not on file  . Alcohol Use: Yes     Comment: with dinner sometimes  . Drug Use: No  . Sexual Activity: No   Other Topics Concern  . Not on file   Social History Narrative   Patient lives at home with her husband   Patient is right handed   Patient drinks coffee daily    Medications:   Current Outpatient Prescriptions on File Prior to Visit  Medication Sig Dispense Refill  .  fludrocortisone (FLORINEF) 0.1 MG tablet Take 1 tablet (0.1 mg total) by mouth daily.  30 tablet  3  . midodrine (PROAMATINE) 10 MG tablet Take 10 mg by mouth 4 (four) times daily.      . risperiDONE (RISPERDAL) 0.5 MG tablet Take 1 tablet (0.5 mg total) by mouth 2 (two) times daily.  60 tablet  1  . Rosuvastatin Calcium (CRESTOR PO) Take 1  tablet by mouth daily.       No current facility-administered medications on file prior to visit.    Allergies:   Allergies  Allergen Reactions  . Atorvastatin Other (See Comments)    Muscle cramps  . Lovastatin Other (See Comments)    Muscle cramps  . Pravastatin Sodium Other (See Comments)    Muscle cramps  . Prednisone Other (See Comments)    Vision changes  . Simvastatin Other (See Comments)    Muscle cramps    Physical Exam General: well developed, well nourished, seated, in no evident distress Head: head normocephalic and atraumatic. Orohparynx benign Neck: supple with no carotid or supraclavicular bruits Cardiovascular: regular rate and rhythm, no murmurs Musculoskeletal: no deformity Skin:  no rash/petichiae Vascular:  Normal pulses all extremities Filed Vitals:   09/16/14 1314  BP: 73/49  Pulse: 90   Neurologic Exam Mental Status: Awake and fully alert. Oriented to place and time. Recent and remote memory intact. Attention span, concentration and fund of knowledge slightly diminished Mood and affect appropriate.  Cranial Nerves: Fundoscopic exam reveals sharp disc margins. Pupils equal, briskly reactive to light. Extraocular movements full without nystagmus. Visual fields full to confrontation. Hearing intact. Facial sensation intact. Face, tongue, palate moves normally and symmetrically.  Motor: Normal bulk and tone. Normal strength in all tested extremity muscles. Mild left grip weakness. Diminished fine finger movements on the left. Orbits right over left approximately. Mild intermittent left hemi-choreiform movements with quasi purposive nature. No lip smacking. Milk maid sign negative. Sensory.: intact to touch and pinprick and vibratory sensation.  Coordination: Rapid alternating movements normal in all extremities. Finger-to-nose and heel-to-shin performed accurately bilaterally. Gait and Station: Arises from chair without difficulty. Stance is normal. Gait  demonstrates normal stride length and balance . Able to heel, toe and tandem walk without difficulty.  Reflexes: 1+ and symmetric. Toes downgoing.   NIHSS  1 Modified Rankin 2  ASSESSMENT: 1.84 year Caucasian lady with right MCA branch infarct in September 2015 secondary to embolization from proximal right ICA occlusion with known history of atrial fibrillation status post recanalization of the terminal right ICA, right middle and anterior cerebral artery endovascularly viar left internal carotid artery using  Solitaire device and intra-arterial 10 mg of Integrilin with persistent proximal right ICA occlusion in the neck. 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 I encouraged her to change aspirin to Xarelto for secondary stroke prevention given history of atrial fibrillation. I discussed risk benefit of Xarelto including side effects and answered questions. Continue Risperdal 0.5 mg twice daily for her hemichorea which appears to be improving. Continue Florinef and the total for her symptomatic orthostatic hypotension and may consider switching to Droxidopa in the future. Follow up with Dr. Erlinda Hong in 2 months or call  earlier if necessary    Note: This document was prepared with digital dictation and possible smart phrase technology. Any transcriptional errors that result from this process are unintentional

## 2014-09-16 NOTE — Patient Instructions (Signed)
I had a long discussion with the patient and her husband regarding a recent admission for stroke, personally reviewed imaging studies and stroke workup and answered questions. I encouraged her to change aspirin to Xarelto for secondary stroke prevention given history of atrial fibrillation. I discussed risk benefit of Xarelto including side effects and answered questions. Continue Risperdal 0.5 mg twice daily for her hemichorea which appears to be improving. Continue Florinef and the total for her symptomatic orthostatic hypotension and may consider switching to Droxidopa in the future. Follow up with Dr. Erlinda Hong in 2 months or call earlier if necessary  Stroke Prevention Some medical conditions and behaviors are associated with an increased chance of having a stroke. You may prevent a stroke by making healthy choices and managing medical conditions. HOW CAN I REDUCE MY RISK OF HAVING A STROKE?   Stay physically active. Get at least 30 minutes of activity on most or all days.  Do not smoke. It may also be helpful to avoid exposure to secondhand smoke.  Limit alcohol use. Moderate alcohol use is considered to be:  No more than 2 drinks per day for men.  No more than 1 drink per day for nonpregnant women.  Eat healthy foods. This involves:  Eating 5 or more servings of fruits and vegetables a day.  Making dietary changes that address high blood pressure (hypertension), high cholesterol, diabetes, or obesity.  Manage your cholesterol levels.  Making food choices that are high in fiber and low in saturated fat, trans fat, and cholesterol may control cholesterol levels.  Take any prescribed medicines to control cholesterol as directed by your health care provider.  Manage your diabetes.  Controlling your carbohydrate and sugar intake is recommended to manage diabetes.  Take any prescribed medicines to control diabetes as directed by your health care provider.  Control your  hypertension.  Making food choices that are low in salt (sodium), saturated fat, trans fat, and cholesterol is recommended to manage hypertension.  Take any prescribed medicines to control hypertension as directed by your health care provider.  Maintain a healthy weight.  Reducing calorie intake and making food choices that are low in sodium, saturated fat, trans fat, and cholesterol are recommended to manage weight.  Stop drug abuse.  Avoid taking birth control pills.  Talk to your health care provider about the risks of taking birth control pills if you are over 67 years old, smoke, get migraines, or have ever had a blood clot.  Get evaluated for sleep disorders (sleep apnea).  Talk to your health care provider about getting a sleep evaluation if you snore a lot or have excessive sleepiness.  Take medicines only as directed by your health care provider.  For some people, aspirin or blood thinners (anticoagulants) are helpful in reducing the risk of forming abnormal blood clots that can lead to stroke. If you have the irregular heart rhythm of atrial fibrillation, you should be on a blood thinner unless there is a good reason you cannot take them.  Understand all your medicine instructions.  Make sure that other conditions (such as anemia or atherosclerosis) are addressed. SEEK IMMEDIATE MEDICAL CARE IF:   You have sudden weakness or numbness of the face, arm, or leg, especially on one side of the body.  Your face or eyelid droops to one side.  You have sudden confusion.  You have trouble speaking (aphasia) or understanding.  You have sudden trouble seeing in one or both eyes.  You have sudden trouble  walking.  You have dizziness.  You have a loss of balance or coordination.  You have a sudden, severe headache with no known cause.  You have new chest pain or an irregular heartbeat. Any of these symptoms may represent a serious problem that is an emergency. Do not wait  to see if the symptoms will go away. Get medical help at once. Call your local emergency services (911 in [place] Do not drive yourself to the hospital. Document Released: 01/11/2005 Document Revised: 04/20/2014 Document Reviewed: 06/06/2013 [facility] Patient Information 2015 [place], [place]. This information is not intended to replace advice given to you by your health care provider. Make sure you discuss any questions you have with your health care provider.

## 2014-09-21 ENCOUNTER — Telehealth: Payer: Self-pay | Admitting: Neurology

## 2014-09-21 NOTE — Telephone Encounter (Signed)
Patient is calling-she passed out last Friday and Saturday- she started taking Xarelto last week and symptoms seems to be worse-please call and advise.

## 2014-09-22 NOTE — Telephone Encounter (Signed)
I called pt back during lunch time. She stated that she passed out twice, one was last Friday during lunch and the other was last Saturday she was sitting in chair. Husband witness the episodes and stated that her SBP was as low as 70-80. She woke up in 2-3 min. Her BP was still low recently since discharge, currently average SBP is around 80s-100. She is on midodrin 10mg  qid, florinef 0.1mg , Xarelto and crestor. She is also on TED hose for hypotension. She was wondering whether xarelto caused her low BP. I told her that Xarelto has no side effect on BP as far as I know. She has long standing orthostatic hypotension, not able to tolerate salt tablets. From my conversation with her, I am not concerning any CNS etiology for her passing out. I encouraged her to call her PCP regarding management of orthostatic hypotension. She is waiting for her PCP to call back. I also suggest her to discuss with PCP regarding the new FDA-approved meds - Droxidopa - for orthostatic hypotension. I also recommend her to sleep with head of bed elevation at 20-30 degree, drink plenty fluid, wear TED hoses. I will check on her again by phone call tomorrow.  Rosalin Hawking, MD PhD Stroke Neurology 09/22/2014 5:44 PM

## 2014-09-22 NOTE — Telephone Encounter (Signed)
Dr. Leonie Man please see notes below best contact for patient is [phone] this is her home number.

## 2014-09-22 NOTE — Telephone Encounter (Signed)
DR. Erlinda Hong, please refer to note below patient called in 09/21/2014.

## 2014-09-22 NOTE — Telephone Encounter (Signed)
This Dr Erlinda Hong `s pt. I saw last week as appointment had been inadvertently scheduled. Let him address

## 2014-09-24 NOTE — Telephone Encounter (Signed)
Talked to pt over the phone today. She stated that her PCP is in [place] on vacation and her cardiologist is also on vacation but she was able to see another cardiologist yesterday and was taken off the Xarelto in fear of falling. She is back on ASA 325mg  now. Also the cardiologist thinks droxidopa is not appropriate for her. She felt somehow better today although still some dizziness. She is going to see her cardiologist on 10/02/14. She will give [place] a call if she has more questions or issues.  Rosalin Hawking, MD PhD Stroke Neurology 09/24/2014 4:34 PM

## 2014-09-24 NOTE — Telephone Encounter (Signed)
She your note below.

## 2014-12-01 ENCOUNTER — Encounter: Payer: Self-pay | Admitting: *Deleted

## 2014-12-02 ENCOUNTER — Encounter: Payer: Self-pay | Admitting: Neurology

## 2014-12-02 ENCOUNTER — Ambulatory Visit (INDEPENDENT_AMBULATORY_CARE_PROVIDER_SITE_OTHER): Payer: Medicare Other | Admitting: Neurology

## 2014-12-02 VITALS — BP 97/59 | HR 91 | Ht 66.0 in | Wt 168.0 lb

## 2014-12-02 DIAGNOSIS — I639 Cerebral infarction, unspecified: Secondary | ICD-10-CM

## 2014-12-02 DIAGNOSIS — I480 Paroxysmal atrial fibrillation: Secondary | ICD-10-CM

## 2014-12-02 DIAGNOSIS — I63411 Cerebral infarction due to embolism of right middle cerebral artery: Secondary | ICD-10-CM

## 2014-12-02 DIAGNOSIS — I951 Orthostatic hypotension: Secondary | ICD-10-CM

## 2014-12-02 NOTE — Patient Instructions (Signed)
-   start Xarelto 20mg  once a day from tomorrow morning. Stop taking ASA and plavix tomorrow. - continue midodrine for low BP - wait for the droxidopa to see how effective it is. - follow up with Dr. Einar Gip for orthostatic hypotension - discuss with Dr. Einar Gip to see if you should repeat 2D echo to re-evaluate EF since you are much better than the time of admission - check BP at home with BP monitor device at arm not wrist - avoid fall - follow up in 2 months.

## 2014-12-02 NOTE — Progress Notes (Signed)
STROKE NEUROLOGY FOLLOW UP NOTE  NAME: Susan [name] DOB: 05/15/1929  REASON FOR VISIT: stroke follow up HISTORY FROM: pt and chart  Today we had the pleasure of seeing Susan [name] in follow-up at our Neurology Clinic. Pt was accompanied by husband.   History Summary 80 year old Caucasian lady with significant PMH of HTN, HLD, pAfib not on anticoagulation, s/p pacemaker placement, remote chemotherapy and autonomic neuropathy with orthostatic hypotension on midodrine was admitted on 08/26/14 with sudden onset of left hemiplegia and difficulty talking. Initial NIH stroke scale was 10 on admission. CT scan showed a hyperdense right middle cerebral artery sign without areas of hemorrhage or ischemia. Patient was outside the IV TPA window but was taken for urgent endovascular intervention which showed recannulization of the terminal right ICA, right MCA and ACA using one pass of Solitaire FR device and 10 mg of superselective intra-arterial Integrilin through the left internal carotid artery approach. However, there was persistent occlusion of the proximal right ICA despite attempted revascularization with solitaire and penumbra devices. Rt MCA and RT ACA And RT ICA terminus fill via ACOM from LT ICA promptly.   The patient clinically did quite well. Postprocedure CT scan showed a Fos[place]ick area ofsubdural and temporal hemorrhage, however subsequent CT scan showed stable appearance. Patient was extubated. Hospital course was complicated by an exacerbation of asthma requiring treatment with inhalers and nebulizers. Transthoracic echo showed poor ejection fraction of 20-25% but without definite clot. LDL cholesterol was 22 on Crestor 5 mg daily prior to admission. Hemoglobin A1c was 6.6. 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 She continued to have orthostatic hypotension and her midodrine was resumed and fludrocortisone was added into her regimen.  Follow  up 09/16/14 (PS) - Patient was seen by physical and occupational therapy and felt to be able to discharge home. She has done well at home and is walking with a walker. She has not yet started home physical therapy but plans to do so in the next few days. The left hemichorea movements are much less but are still present. She has history of chronic orthostatic hypotension and dizziness due to presumed autonomic neuropathy related to chemotherapy in the past for cancer. She takes Florinef and midodrine which seemed to help her. Repeat CT head showed hemorrhage was resolved and Xarelto was recommended.  Interval History During the interval time, the patient has been doing better. Her Xarelto was not started as his cardiologist in [place] hold off anticoagulation due to her falls. She was put on ASA and plavix. She still has passing out episodes but much better and less frequent. For the last 3 months, she has had no passing out episodes. She questioned whether her EF still 25-30% now or it has been much improved. Her hemichorea has been resolved and her risperdal has been discontinued. Her fludrocortisone was also discontinued due to excessive sweating. She is on midodrine but droxidopa has been ordered by her PCP and cardiologist.  REVIEW OF SYSTEMS: Full 14 system review of systems performed and notable only for those listed below and in HPI above, all others are negative:  Constitutional: appetite change  Cardiovascular: N/A  Ear/Nose/Throat: N/A  Skin: N/A  Eyes: N/A  Respiratory: N/A  Gastroitestinal: N/A  Genitourinary: N/A Hematology/Lymphatic: N/A  Endocrine: N/A  Musculoskeletal: N/A  Allergy/Immunology: N/A  Neurological: dizziness and passing out  Psychiatric: N/A  The following represents the patient's updated allergies and side effects list: Allergies  Allergen Reactions  . Atorvastatin Other (See Comments)    Muscle cramps  . Lovastatin Other (See Comments)    Muscle cramps  .  Pravastatin Sodium Other (See Comments)    Muscle cramps  . Prednisone Other (See Comments)    Vision changes  . Simvastatin Other (See Comments)    Muscle cramps    The neurologically relevant items on the patient's problem list were reviewed on today's visit.  Neurologic Examination  A problem focused neurological exam (12 or more points of the single system neurologic examination, vital signs counts as 1 point, cranial nerves count for 8 points) was performed.  Blood pressure 97/59, pulse 91, height 5\' 6"  (1.676 m), weight 168 lb (76.204 kg).  Orthostatic vitals - lying 128/74 - 97, sitting 159/87 - 94, standing 97/59 - 91 - asymptomatic  General - Well nourished, well developed, in no apparent distress.  Ophthalmologic - Sharp disc margins OU.  Cardiovascular - Regular rate and rhythm with no murmur, no irregular heart rhythm.  Mental Status -  Level of arousal and orientation to time, place, and person were intact. Language including expression, naming, repetition, comprehension, reading, and writing was assessed and found intact. Attention span and concentration were normal. Recent and remote memory were intact. Fund of Knowledge was assessed and was intact.  Cranial Nerves II - XII - II - Visual field intact OU. III, IV, VI - Extraocular movements intact. V - Facial sensation intact bilaterally. VII - Facial movement intact bilaterally. VIII - Hearing & vestibular intact bilaterally. X - Palate elevates symmetrically. XI - Chin turning & shoulder shrug intact bilaterally. XII - Tongue protrusion intact.  Motor Strength - The patient's strength was normal in all extremities and pronator drift was absent.  Bulk was normal and fasciculations were absent.   Motor Tone - Muscle tone was assessed at the neck and appendages and was normal.  Reflexes - The patient's reflexes were normal in all extremities and she had no pathological reflexes.  Sensory - Light touch,  temperature/pinprick were assessed and were normal.    Coordination - The patient had normal movements in the hands and feet with no ataxia or dysmetria.  Tremor was absent.  Gait and Station - deferred due to safety.  Data reviewed: I personally reviewed the images and agree with the radiology interpretations.  Ct Head Wo Contrast  09/10/14 - Resolving intracranial hemorrhage. Resolving cytotoxic edema in the RIGHT hemisphere. No new findings.  08/31/14 - 1. Stable appearance of focal hyperdense focus of the right sylvian  fissure. A portion of this may be parenchymal compatible with a  hemorrhagic infarction or chronic contrast staining. This is not  favored to be intravascular. It is unlikely that the entire density  is subarachnoid either.  2. Better definition of evolving nonhemorrhagic infarct in the right  sylvian fissure as well.   08/28/2014 IMPRESSION: Similar sub cm rounded density in RIGHT sylvian fissure probable thrombus, possibly intravascular with trace residual subarachnoid blood. No acute large vascular territory infarct.    08/27/2014 IMPRESSION: Rounded sub cm density in the right sylvian fissure, is unclear if this reflects focal subarachnoid blood or, enhancing thrombus, with residual surrounding subarachnoid blood. No CT findings of acute large MCA territory infarct.    08/26/2014 IMPRESSION: Foci of high attenuation along the course of the RIGHT middle cerebral artery within the RIGHT sylvian fissure likely representing residual contrast within thrombus of the RIGHT MCA post angiography. Developing RIGHT insular and suspect basal ganglia  infarcts. No definite intracranial hemorrhage.   08/26/2014 IMPRESSION: 1. New dense right MCA, concerning for acute thrombus. Very early edema is questioned in the right temporoparietal region. 2. No acute intracranial hemorrhage.   2D ehco  - Left ventricle: The cavity size was normal. Wall thickness was increased in a  pattern of mild LVH. Systolic function was severely reduced. The estimated ejection fraction was in the range of 20% to 25%. Diffuse hypokinesis. Features are consistent with a pseudonormal left ventricular filling pattern, with concomitant abnormal relaxation and increased filling pressure (grade 2 diastolic dysfunction). - Mitral valve: There was mild regurgitation. - Left atrium: The atrium was mildly dilated. - Right atrium: The atrium was mildly dilated. - Pulmonary arteries: Systolic pressure was mildly increased. PA peak pressure: 32 mm Hg (S).  Assessment: As you may recall, she is a 80 y.o. Caucasian female with PMH of HTN, HLD, pAfib not on anticoagulation, s/p pacemaker placement, remote chemotherapy and autonomic neuropathy with orthostatic hypotension on midodrine was admitted on 08/26/14 for right MCA stroke s/p mechanical thrombectomy with recannulization and hemorrhagic transformation. Stroke most likely due to Afib not on [facility]. She also developed hemichorea on the left and put on risperdal. Repeat CT hemorrhage resolved, however, anticoagulation was held by her previous cardiologist. She is on ASA and plavix now. Her hemichorea resolved. However her severe orthostatic hypotension has not in good control. She is on midodrine and about to start droxidopa. Florinef was discontinued due to intolerance. 2D echo during hospitalization showed EF 20-25% likely due to severe asthma, pleural effusion and CHF at that time, she is much improved, recommend to reevaluation with 2D echo.  Plan:  - start Xarelto and discontinue ASA and plavix - continue midodrine and start droxidopa when it is available - follow up with Dr. Einar Gip for orthostatic hypotension - discuss with Dr. Einar Gip to see if 2D echo can be repeat - check BP at home - avoid fall - RTC in 2 months.  No orders of the defined types were placed in this encounter.    Meds ordered this encounter  Medications  . clopidogrel (PLAVIX)  75 MG tablet    Sig: Take 75 mg by mouth daily.    Refill:  1  . digoxin (LANOXIN) 0.125 MG tablet    Sig: Take 125 mcg by mouth daily.    Refill:  3  . methylPREDNIsolone (MEDROL DOSPACK) 4 MG tablet    Sig: 4 mg See admin instructions.    Refill:  0  . PROVENTIL HFA 108 (90 BASE) MCG/ACT inhaler    Sig:     Refill:  5  . albuterol (PROVENTIL) (2.5 MG/3ML) 0.083% nebulizer solution    Sig:     Refill:  1  . aspirin 81 MG tablet    Sig: Take 81 mg by mouth daily.    Patient Instructions  - start Xarelto 20mg  once a day from tomorrow morning. Stop taking ASA and plavix tomorrow. - continue midodrine for low BP - wait for the droxidopa to see how effective it is. - follow up with Dr. Einar Gip for orthostatic hypotension - discuss with Dr. Einar Gip to see if you should repeat 2D echo to re-evaluate EF since you are much better than the time of admission - check BP at home with BP monitor device at arm not wrist - avoid fall - follow up in 2 months.   Rosalin Hawking, MD PhD [facility] [address] [place], [place] [address] [phone]  [phone]   

## 2014-12-03 DIAGNOSIS — I480 Paroxysmal atrial fibrillation: Secondary | ICD-10-CM | POA: Insufficient documentation

## 2014-12-03 DIAGNOSIS — I63411 Cerebral infarction due to embolism of right middle cerebral artery: Secondary | ICD-10-CM | POA: Insufficient documentation

## 2014-12-16 ENCOUNTER — Inpatient Hospital Stay (HOSPITAL_COMMUNITY)
Admission: AD | Admit: 2014-12-16 | Discharge: 2014-12-18 | DRG: 308 | Disposition: E | Payer: Medicare Other | Source: Other Acute Inpatient Hospital | Attending: Internal Medicine | Admitting: Internal Medicine

## 2014-12-16 DIAGNOSIS — I10 Essential (primary) hypertension: Secondary | ICD-10-CM | POA: Diagnosis present

## 2014-12-16 DIAGNOSIS — Z7952 Long term (current) use of systemic steroids: Secondary | ICD-10-CM

## 2014-12-16 DIAGNOSIS — Z66 Do not resuscitate: Secondary | ICD-10-CM | POA: Diagnosis present

## 2014-12-16 DIAGNOSIS — Z7902 Long term (current) use of antithrombotics/antiplatelets: Secondary | ICD-10-CM | POA: Diagnosis not present

## 2014-12-16 DIAGNOSIS — G936 Cerebral edema: Secondary | ICD-10-CM | POA: Diagnosis present

## 2014-12-16 DIAGNOSIS — I469 Cardiac arrest, cause unspecified: Secondary | ICD-10-CM | POA: Diagnosis present

## 2014-12-16 DIAGNOSIS — Z7982 Long term (current) use of aspirin: Secondary | ICD-10-CM

## 2014-12-16 DIAGNOSIS — Z950 Presence of cardiac pacemaker: Secondary | ICD-10-CM | POA: Diagnosis not present

## 2014-12-16 DIAGNOSIS — G9382 Brain death: Secondary | ICD-10-CM | POA: Diagnosis present

## 2014-12-16 DIAGNOSIS — E780 Pure hypercholesterolemia: Secondary | ICD-10-CM | POA: Diagnosis present

## 2014-12-16 DIAGNOSIS — I471 Supraventricular tachycardia: Secondary | ICD-10-CM | POA: Diagnosis present

## 2014-12-16 DIAGNOSIS — Z888 Allergy status to other drugs, medicaments and biological substances status: Secondary | ICD-10-CM | POA: Diagnosis not present

## 2014-12-16 DIAGNOSIS — Z8673 Personal history of transient ischemic attack (TIA), and cerebral infarction without residual deficits: Secondary | ICD-10-CM | POA: Diagnosis not present

## 2014-12-16 DIAGNOSIS — J9600 Acute respiratory failure, unspecified whether with hypoxia or hypercapnia: Secondary | ICD-10-CM | POA: Diagnosis present

## 2014-12-16 DIAGNOSIS — G935 Compression of brain: Secondary | ICD-10-CM | POA: Diagnosis present

## 2014-12-16 DIAGNOSIS — I951 Orthostatic hypotension: Secondary | ICD-10-CM | POA: Diagnosis present

## 2014-12-16 DIAGNOSIS — I4901 Ventricular fibrillation: Secondary | ICD-10-CM | POA: Diagnosis present

## 2014-12-16 DIAGNOSIS — I4891 Unspecified atrial fibrillation: Secondary | ICD-10-CM | POA: Diagnosis present

## 2014-12-16 LAB — MRSA PCR SCREENING: MRSA by PCR: NEGATIVE

## 2014-12-16 MED ORDER — SODIUM CHLORIDE 0.9 % IV SOLN
INTRAVENOUS | Status: DC
  Administered 2014-12-16: 10:00:00 via INTRAVENOUS

## 2014-12-16 MED ORDER — SODIUM CHLORIDE 0.9 % IV SOLN: 250.0000 mL | INTRAVENOUS | Status: DC | PRN

## 2014-12-16 MED FILL — Medication: Qty: 1 | Status: AC

## 2014-12-16 MED FILL — Diltiazem HCl IV For Soln 100 MG: INTRAVENOUS | Qty: 100 | Status: AC

## 2014-12-16 MED FILL — Fentanyl Citrate Inj 0.05 MG/ML: INTRAMUSCULAR | Qty: 2 | Status: AC

## 2014-12-16 MED FILL — Midazolam HCl Inj 5 MG/5ML (Base Equivalent): INTRAMUSCULAR | Qty: 5 | Status: AC

## 2014-12-16 MED FILL — Epinephrine HCl Inj 1 MG/ML: INTRAMUSCULAR | Qty: 1 | Status: AC

## 2014-12-16 MED FILL — Dextrose Inj 5%: INTRAVENOUS | Qty: 1000 | Status: AC

## 2014-12-18 NOTE — Code Documentation (Signed)
CODE BLUE NOTE  Patient Name: Susan [name]   MRN: [id number]   Date of Birth/ Sex: [birth year] , female      Admission Date: 12-17-14  Attending Provider: Rigoberto Noel, MD  Primary Diagnosis: <principal problem not specified>    Indication: Pt was in her usual state of health until this AM, when she was noted to be SVT/PEA. Code blue was subsequently called. At the time of arrival on scene, ACLS protocol was underway.    Technical Description:  - CPR performance duration:  4  minutes  - Was defibrillation or cardioversion used? No   - Was external pacer placed? No  - Was patient intubated pre/post CPR? Yes    Medications Administered: Y = Yes; Blank = No Amiodarone    Atropine    Calcium    Epinephrine  Y  Lidocaine    Magnesium    Norepinephrine    Phenylephrine    Sodium bicarbonate    Vasopressin      Post CPR evaluation:  - Final Status - Was patient successfully resuscitated ? Yes - What is current rhythm? ST - What is current hemodynamic status? stable   Miscellaneous Information:  - Labs sent, including: NA  - Primary team notified?  Yes  - Family Notified? Yes  - Additional notes/ transfer status: none   Elberta Leatherwood, MD  2014/12/17, 9:52 AM

## 2014-12-18 NOTE — Progress Notes (Signed)
Chaplain responded to code blue. Chaplain with family when pt passed. Pt pastor very present with family and providing spiritual support. Pt daughter and son arriving shortly. Chaplain will continue to check in with family. Page chaplain as needed.    2014-12-24 1000  Clinical Encounter Type  Visited With Patient and family together  Visit Type Death;Code  Consult/Referral To Faith community  Spiritual Encounters  Spiritual Needs Prayer;Emotional;Grief support  Stress Factors  Family Stress Factors Loss  Lot Medford, Barbette Hair, Chaplain 12/24/2014 10:31 AM

## 2014-12-18 NOTE — Progress Notes (Signed)
MD had full discussion with family regarding patient's condition and poor prognosis. Family all agreed that patient would want to be comfortable and at peace and would not want aggressive care. Patient was made DNR status by Dr. Titus Mould.   Roxan Hockey, RN

## 2014-12-18 NOTE — Progress Notes (Signed)
Patient arrived as transfer from [facility], cardiac arrest patient, already intubated. CPR in progress as patient was coming to the door. Dr. Titus Mould at bedside.  LEVO off on arrival. Sodium Bicarb discontinued.  2 rounds of EPI given. ROSC obtained. See CPR documentation for further notes.    Roxan Hockey, RN

## 2014-12-18 NOTE — Progress Notes (Signed)
RN called into room. PA at bedside. Patient with no pulse, no heartbeat. No respirations. No gag reflexes.  Two nurses at bedside to verify death. Time of death 4. Patient surrounded by loving family.  Verfiied with [name] the death of the above patient and agree. Susan [name], [name] contacted, patient is not a candidate.

## 2014-12-18 NOTE — H&P (Signed)
Name: Susan [name] MRN: [id number] DOB: 04/10/1929    ADMISSION DATE:  2015-01-09  REFERRING MD :  Oval Linsey   CHIEF COMPLAINT:  Post arrest   BRIEF PATIENT DESCRIPTION: 81yo female with hx AFib not on anticoagulation, orthostatic hypotension on milrinone, recent stroke (september 2015) with IR revascularization.  Presented to [facility] 12/30 after collapsing at home. Husband initiated CPR.  Fire dept AED shocked. On EMS arrival pt was in VFib, shocked x 5, amiodarone, epi given.  Approx 30-60 mins total downtime prior to ROSC.  Pt tx to [facility].  En route had extreme HTN, pressors held. On arrival to [facility], had SVT, then brief PEA arrest with ROSC <73mins.    SIGNIFICANT EVENTS    STUDIES:     HISTORY OF PRESENT ILLNESS:  81yo female with hx AFib not on anticoagulation, orthostatic hypotension on milrinone, recent stroke (september 2015) with IR revascularization.  Presented to [facility] 12/30 after collapsing at home. Husband initiated CPR.  Fire dept AED shocked. On EMS arrival pt was in VFib, shocked x 5, amiodarone, epi given.  Approx 30-60 mins total downtime prior to ROSC.  Pt tx to [facility].  En route had extreme HTN, pressors held. On arrival to [facility], had SVT, then brief PEA arrest with ROSC <1mins.    PAST MEDICAL HISTORY :   has a past medical history of Hypercholesteremia; A-fib; Pacemaker; Low blood pressure; and Cancer.  has past surgical history that includes Knee surgery (Left); Ankle surgery (Right); Pacemaker insertion (06/2014); Breast surgery; Cholecystectomy; and Radiology with anesthesia (N/A, 08/26/2014). Prior to Admission medications   Medication Sig Start Date End Date Taking? Authorizing Provider  albuterol (PROVENTIL) (2.5 MG/3ML) 0.083% nebulizer solution  09/07/14   Historical Provider, MD  aspirin 81 MG tablet Take 81 mg by mouth daily.    Historical Provider, MD  clopidogrel (PLAVIX) 75 MG tablet Take 75 mg by mouth daily. 10/08/14    Historical Provider, MD  digoxin (LANOXIN) 0.125 MG tablet Take 125 mcg by mouth daily. 10/31/14   Historical Provider, MD  methylPREDNIsolone (MEDROL DOSPACK) 4 MG tablet 4 mg See admin instructions. 09/08/14   Historical Provider, MD  midodrine (PROAMATINE) 10 MG tablet Take 10 mg by mouth 4 (four) times daily.    Historical Provider, MD  PROVENTIL HFA 108 (90 BASE) MCG/ACT inhaler  09/10/14   Historical Provider, MD  Rosuvastatin Calcium (CRESTOR PO) Take 10 mg by mouth daily.     Historical Provider, MD   Allergies  Allergen Reactions  . Atorvastatin Other (See Comments)    Muscle cramps  . Lovastatin Other (See Comments)    Muscle cramps  . Pravastatin Sodium Other (See Comments)    Muscle cramps  . Prednisone Other (See Comments)    Vision changes  . Simvastatin Other (See Comments)    Muscle cramps    FAMILY HISTORY:  family history is not on file. SOCIAL HISTORY:  reports that she has never smoked. She has never used smokeless tobacco. She reports that she drinks alcohol. She reports that she does not use illicit drugs.  REVIEW OF SYSTEMS:   Unable.  As per HPI.   SUBJECTIVE:   VITAL SIGNS: Pulse Rate:  [125] 125 (12/30 0954) Resp:  [34] 34 (12/30 0954) SpO2:  [93 %] 93 % (12/30 0954) FiO2 (%):  [100 %] 100 % (12/30 0954)  PHYSICAL EXAMINATION: General:  Critically ill appearing, agonal resps on vent, cyanotic  Neuro:  Unresponsive, has received no sedation,  pupils 24mm, fixed, -dolls eyes, -gag  HEENT:  Mm dry, pale, ETT  Cardiovascular:  s1s2 irreg Lungs:  resps agonal on vent, coarse  Abdomen:  Round, distended, -bs  Musculoskeletal:  Pale, cool, mottled   No results for input(s): NA, K, CL, CO2, BUN, CREATININE, GLUCOSE in the last 168 hours. No results for input(s): HGB, HCT, WBC, PLT in the last 168 hours. No results found.  ASSESSMENT / PLAN:  Cardiac arrest - VFib, VT, PEA.  Initial labs ok with K3.1, Scr ok, hgb ok.  Acute respiratory failure - r/t  cardiac arrest  Hx hypotension, syncope -- suspect acute, significant HTN en route was r/t herniation/cerebral edema  AMS - post arrest.  Poor neurologic exam.  Hx recent CVA   Plan -  Cont current support while awaiting family arrival but no escalation of care, medically ineffective.  Dismal prognosis, remains unstable, actively dying.  Once family arrives, consider morphine, comfort care  No further ABG    Nickolas Madrid, NP 2015-01-07  10:16 AM Pager: [phone] or [phone]   STAFF NOTE: I, Merrie Roof, MD FACP have personally reviewed patient's available data, including medical history, events of note, physical examination and test results as part of my evaluation. I have discussed with resident/NP and other care providers such as pharmacist, RN and RRT. In addition, I personally evaluated patient and elicited key findings of: Pt coded on arrival, now total over 1 hr down time, myoclonus at [place] and now brain death exam post arrest, continued support and heroics would be medically ineffective and family, husband does not wish continues heroics, NCB written, death is immanent, levo would not change outcome, HTN in truck herniation>?, no role bicarb, dc The patient is critically ill with multiple organ systems failure and requires high complexity decision making for assessment and support, frequent evaluation and titration of therapies, application of advanced monitoring technologies and extensive interpretation of multiple databases.   Critical Care Time devoted to patient care services described in this note is 54 Minutes. Extensive management and discussions. This time reflects time of care of this signee: Merrie Roof, MD FACP. This critical care time does not reflect procedure time, or teaching time or supervisory time of PA/NP/Med student/Med Resident etc but could involve care discussion time. Rest per NP/medical resident whose note is outlined above and that  I agree with   Lavon Paganini. Titus Mould, MD, Patrick Pgr: [facility] Jan 07, 2015 10:55 AM

## 2014-12-18 DEATH — deceased

## 2014-12-31 NOTE — Discharge Summary (Signed)
NAME:  Susan [name], Susan [name]                       ACCOUNT NO.:  MEDICAL RECORD NO.:  [id number]  LOCATION:                                 FACILITY:  PHYSICIAN:  Raylene Miyamoto, MD DATE OF BIRTH:  Jun 02, 1929  DATE OF ADMISSION: DATE OF DISCHARGE:                              DISCHARGE SUMMARY   DEATH SUMMARY  This is an 81 year old, Caucasian female with a history of atrial fibrillation, not on anticoagulation with a history of orthostatic hypotension, on milrinone with a recent stroke in September 2015, who presented to [facility] on 12/30 after collapsing at home. Husband initiated CPR.  Our department shocked the patient upon arrival, was in VFib, shocked 5 times, amiodarone and epinephrine was given.  She coded for approximately 30-60 minutes of downtime prior to return of spontaneous circulation, and was transferred to [facility].  En route had extreme hypertension and pressures were held on arrival to [facility] and had SVTs and a brief PA consistent or maybe a likely herniation at that time.  Family discussions rapidly made the patient full no code blue with no escalation given the dismal prognosis and horrible neurologic outcome with her mild clonus at the bedside. Comfort care was initiated and the patient expired.  FINAL DIAGNOSES UPON DEATH: 1. Severe anoxic brain injury, status post prolonged cardiac arrest. 2. Acute respiratory failure. 3. No code blue status with no escalation. 4. Ventricular fibrillation cardiac arrest.     Raylene Miyamoto, MD     DJF/MEDQ  D:  12/30/2014  T:  12/30/2014  Job:  [id number]

## 2015-02-09 ENCOUNTER — Ambulatory Visit: Payer: TRICARE For Life (TFL) | Admitting: Neurology

## 2015-02-16 DEATH — deceased

## 2015-08-25 IMAGING — CT CT HEAD W/O CM
2 series · 15 of 30 positions shown, 19 images · non-contrast
Comparison: CT head August 27, 2014

CLINICAL DATA: Follow up stroke with intervention.

EXAM:
CT HEAD WITHOUT CONTRAST
TECHNIQUE: Contiguous axial images were obtained from the base of the skull
through the vertex without intravenous contrast.

[Series 201: head w/o, idose (1) · axial · non-contrast · 0.41mm/px · z∈[+60,+185]mm · 13 of 31 slices shown, 17 images]
[im 3/31  brain]
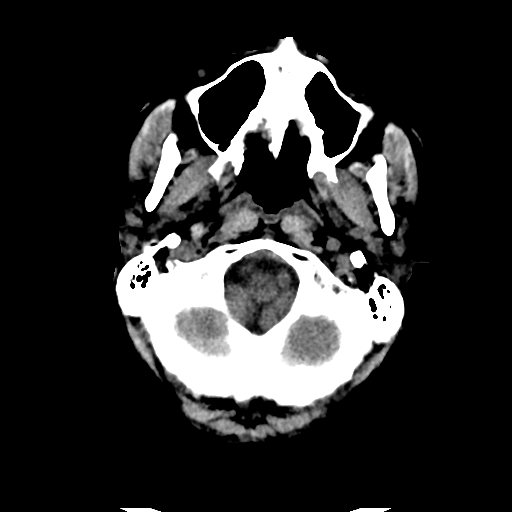
[im 3/31  bone]
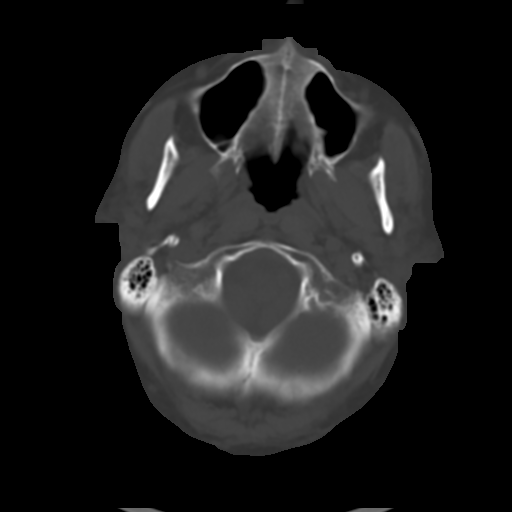
[im 5/31  brain]
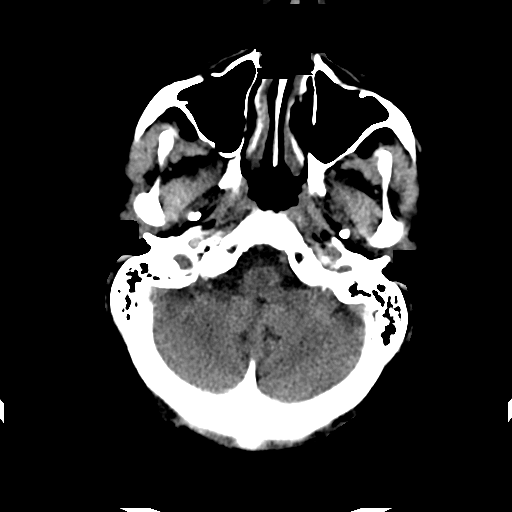
[im 7/31  brain]
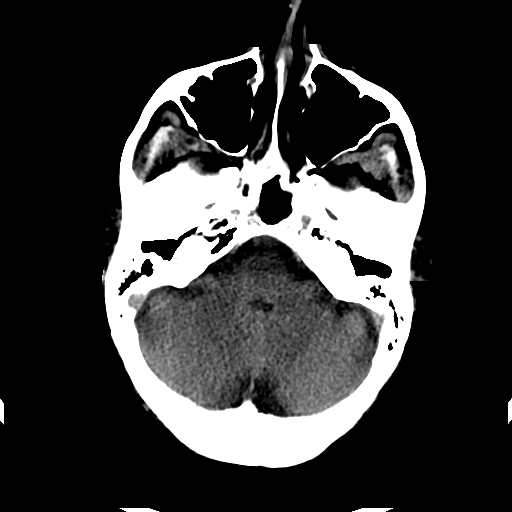
[im 9/31  brain]
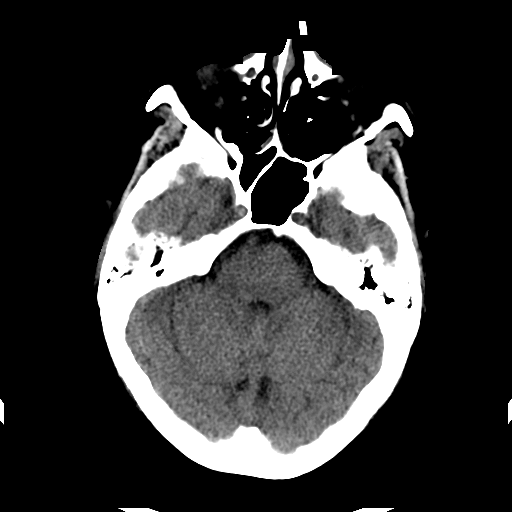
[im 11/31  brain]
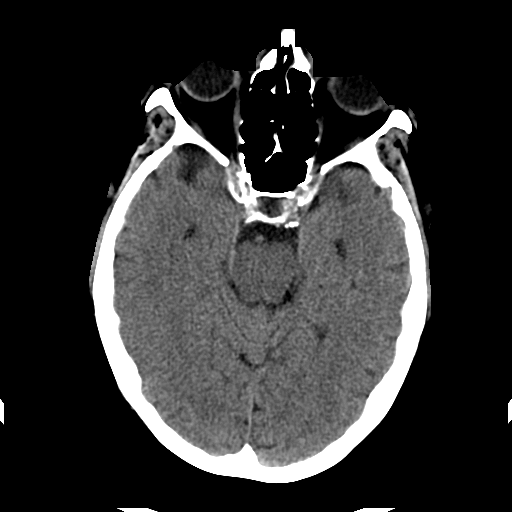
[im 11/31  bone]
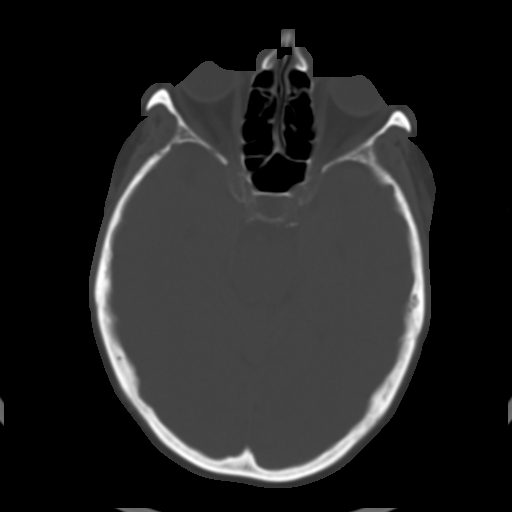
[im 13/31  brain]
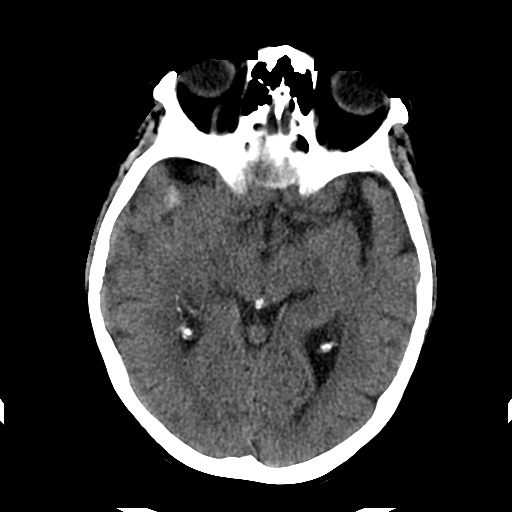
[im 16/31  brain]
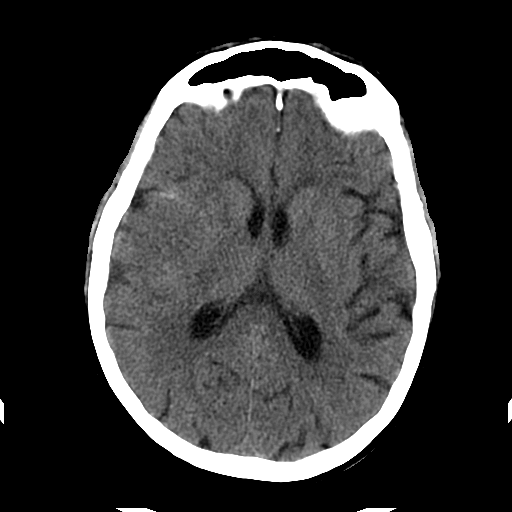
[im 18/31  brain]
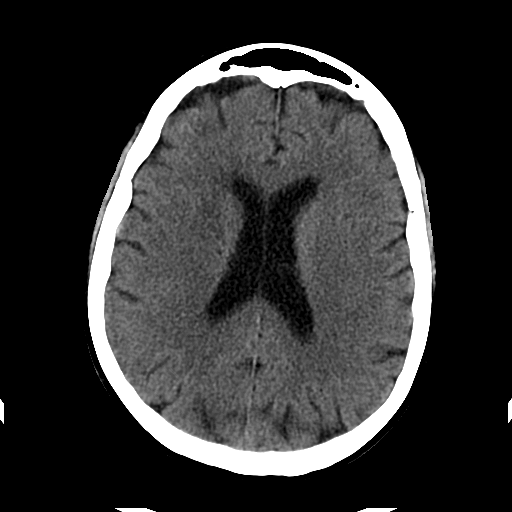
[im 20/31  brain]
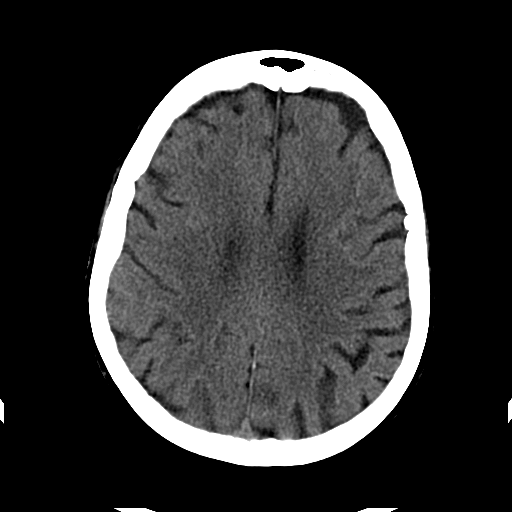
[im 20/31  bone]
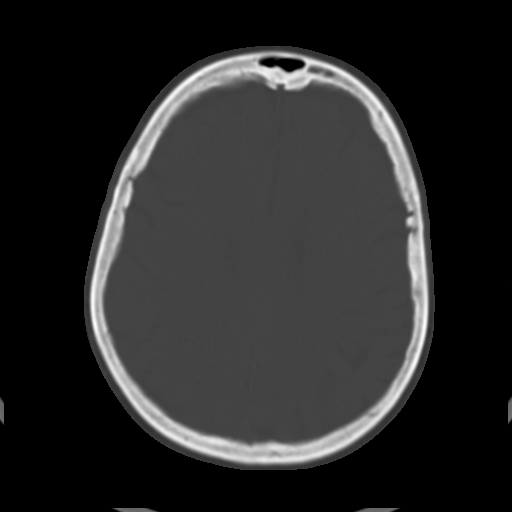
[im 22/31  brain]
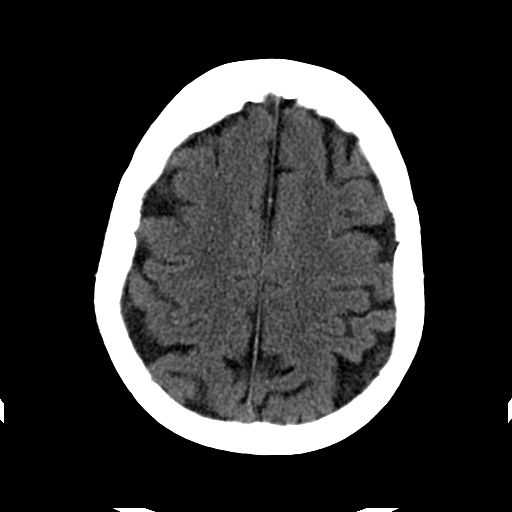
[im 24/31  brain]
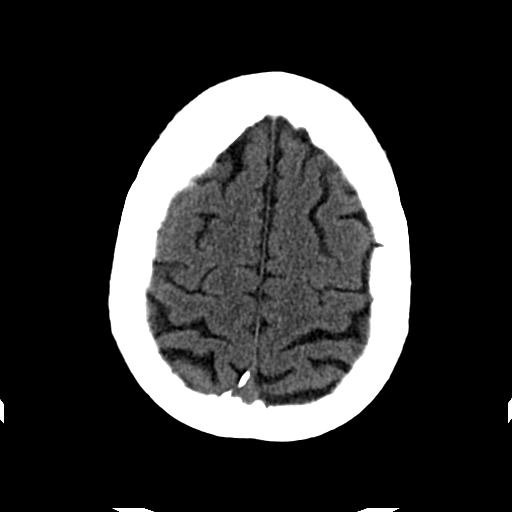
[im 26/31  brain]
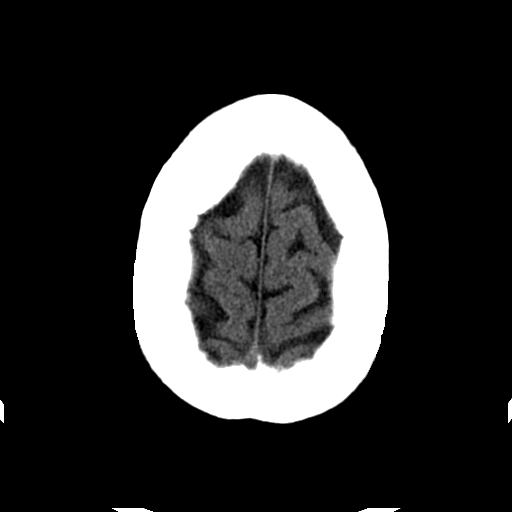
[im 28/31  brain]
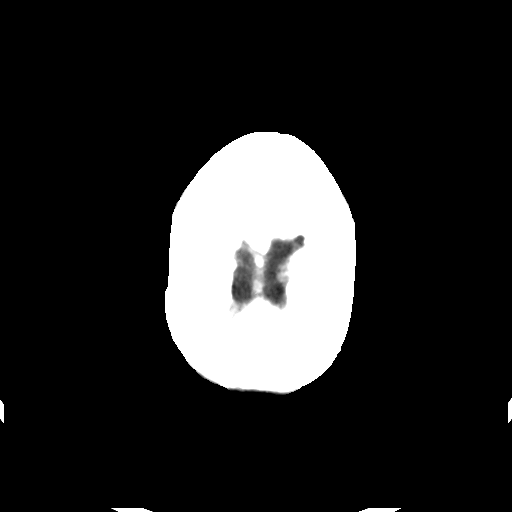
[im 28/31  bone]
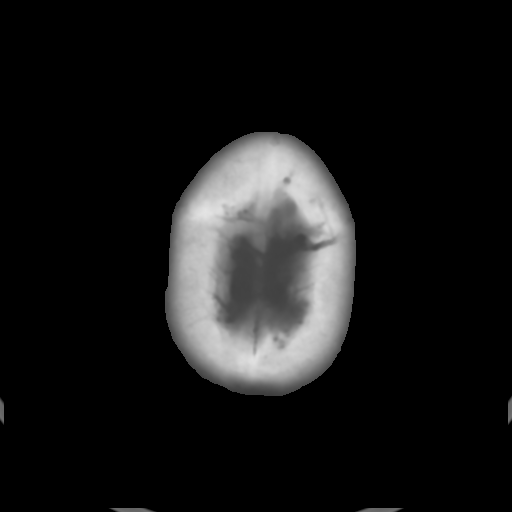

[Series 202: head w/o bone, idose (1) · axial · non-contrast · 0.41mm/px · z∈[+60,+80]mm · 2 of 31 slices shown]
[im 3/31  bone]
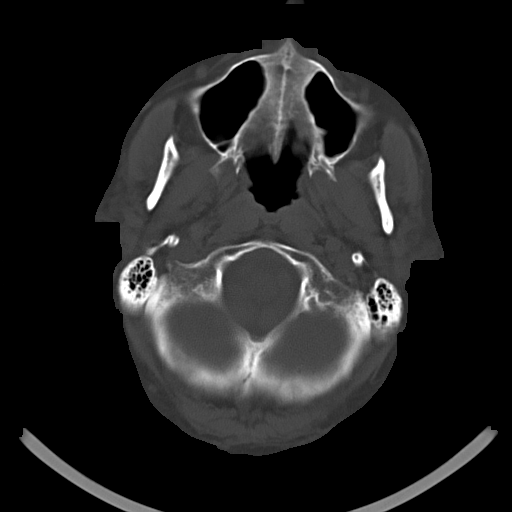
[im 7/31  bone]
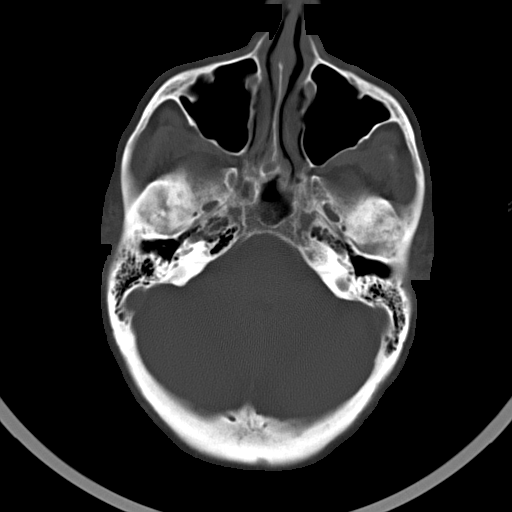

[15 of 30 positions shown; findings below may reference images not displayed]

FINDINGS: Similar 9 mm ovoid density in RIGHT sylvian fissure, with trace
residual subarachnoid blood. No intraparenchymal hemorrhage, mass
effect or midline shift. The ventricles and sulci are normal for
patient's age. Mild white matter changes suggest chronic small
vessel ischemic disease. No acute large vascular territory infarct.

Basal cisterns are patent. No skull fracture. Mild frothy secretions
within sphenoid sinus without air-fluid levels. Small RIGHT
maxillary mucosal retention cyst. Severe temporomandibular
osteoarthrosis. Mastoid air cells are well aerated. Status post
bilateral ocular lens implants.
IMPRESSION: Similar sub cm rounded density in RIGHT sylvian fissure probable
thrombus, possibly intravascular with trace residual subarachnoid
blood.

No acute large vascular territory infarct.

  By: Nomas[place]ulele [place]

## 2015-08-29 IMAGING — CR DG CHEST 2V
2 series · 2 of 2 positions shown · non-contrast
Comparison: Portable chest x-ray 26 August, 2014

CLINICAL DATA: Recent left-sided stroke the new onset of CHF with
shortness of breath and wheezing

EXAM:
CHEST  2 VIEW

[w chest pa]
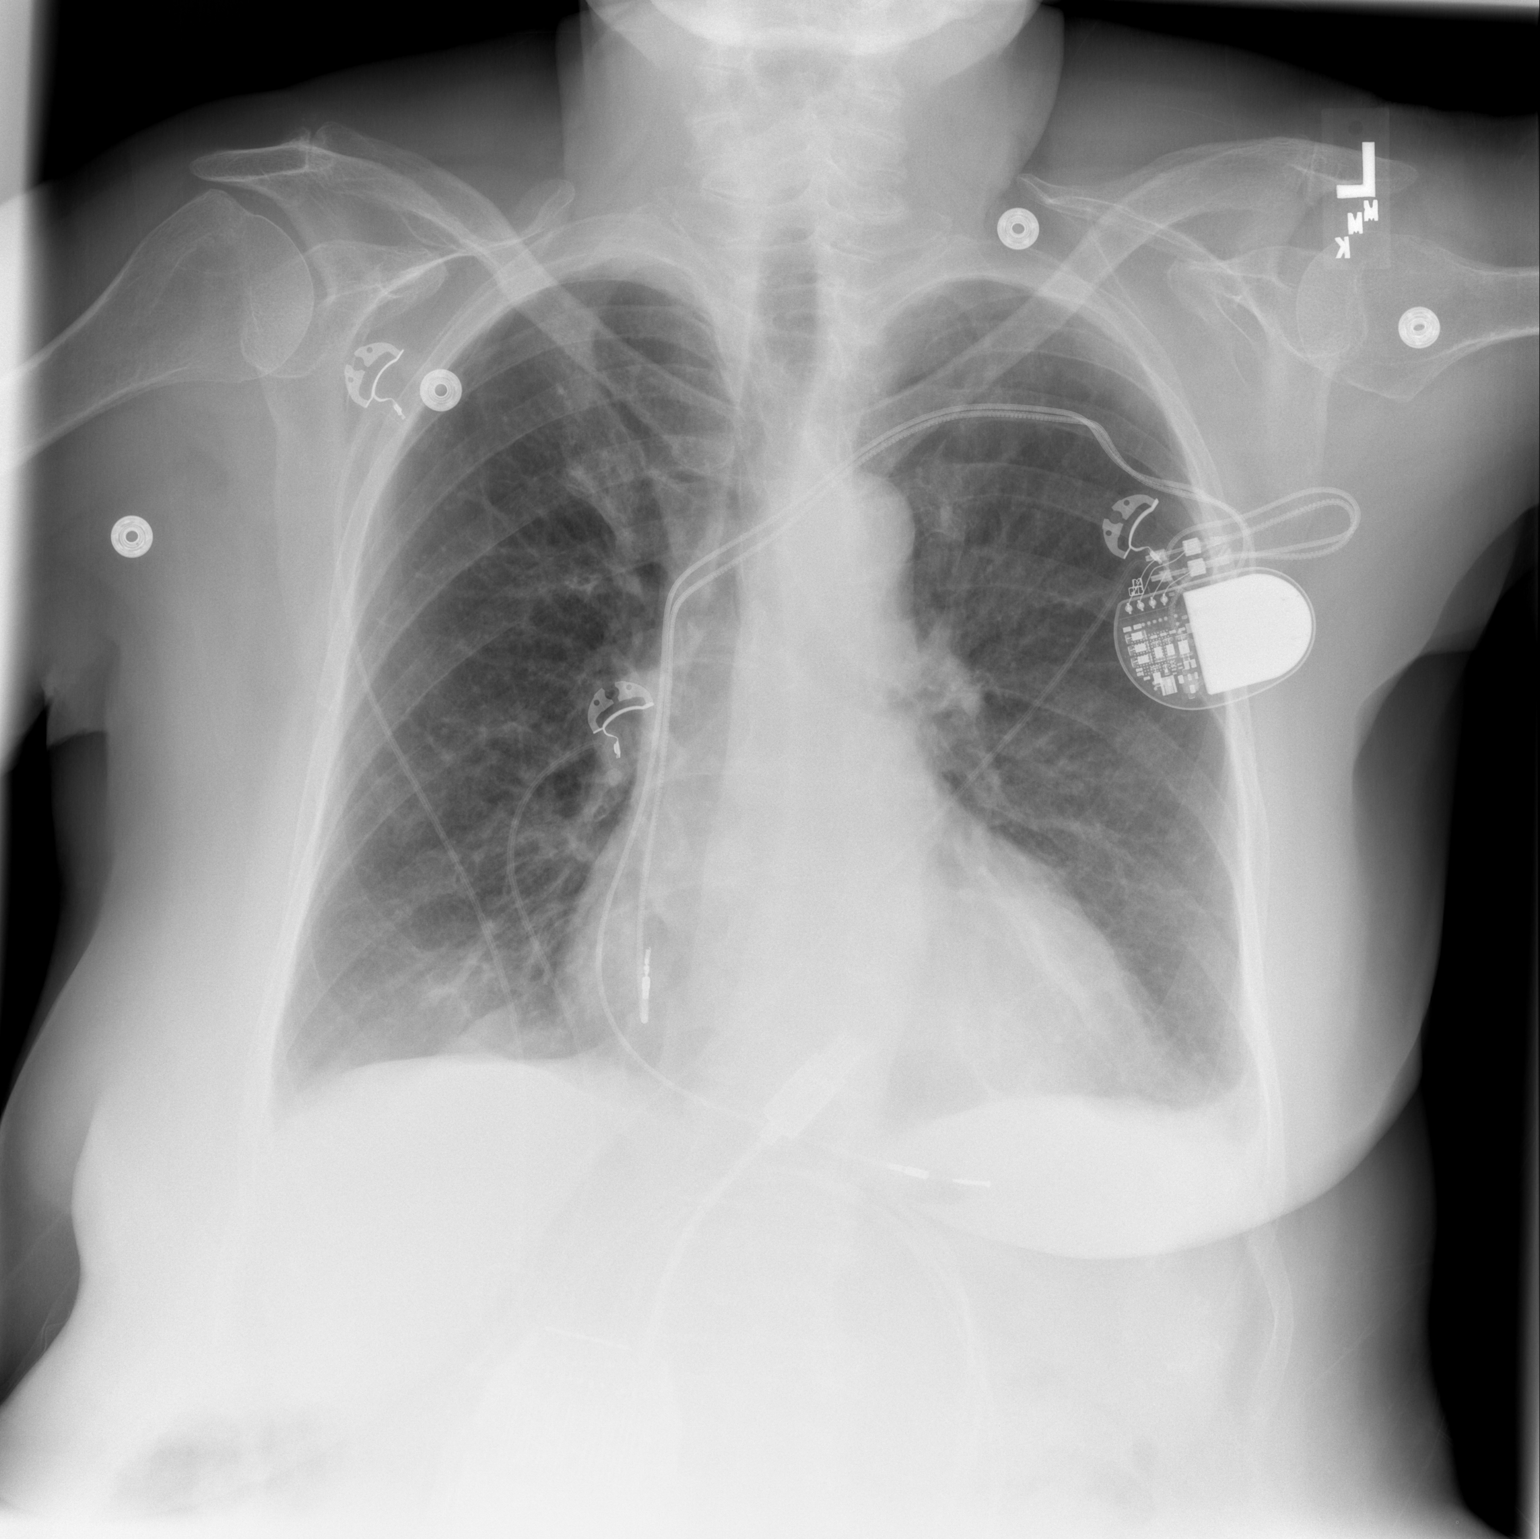

[w chest lat]
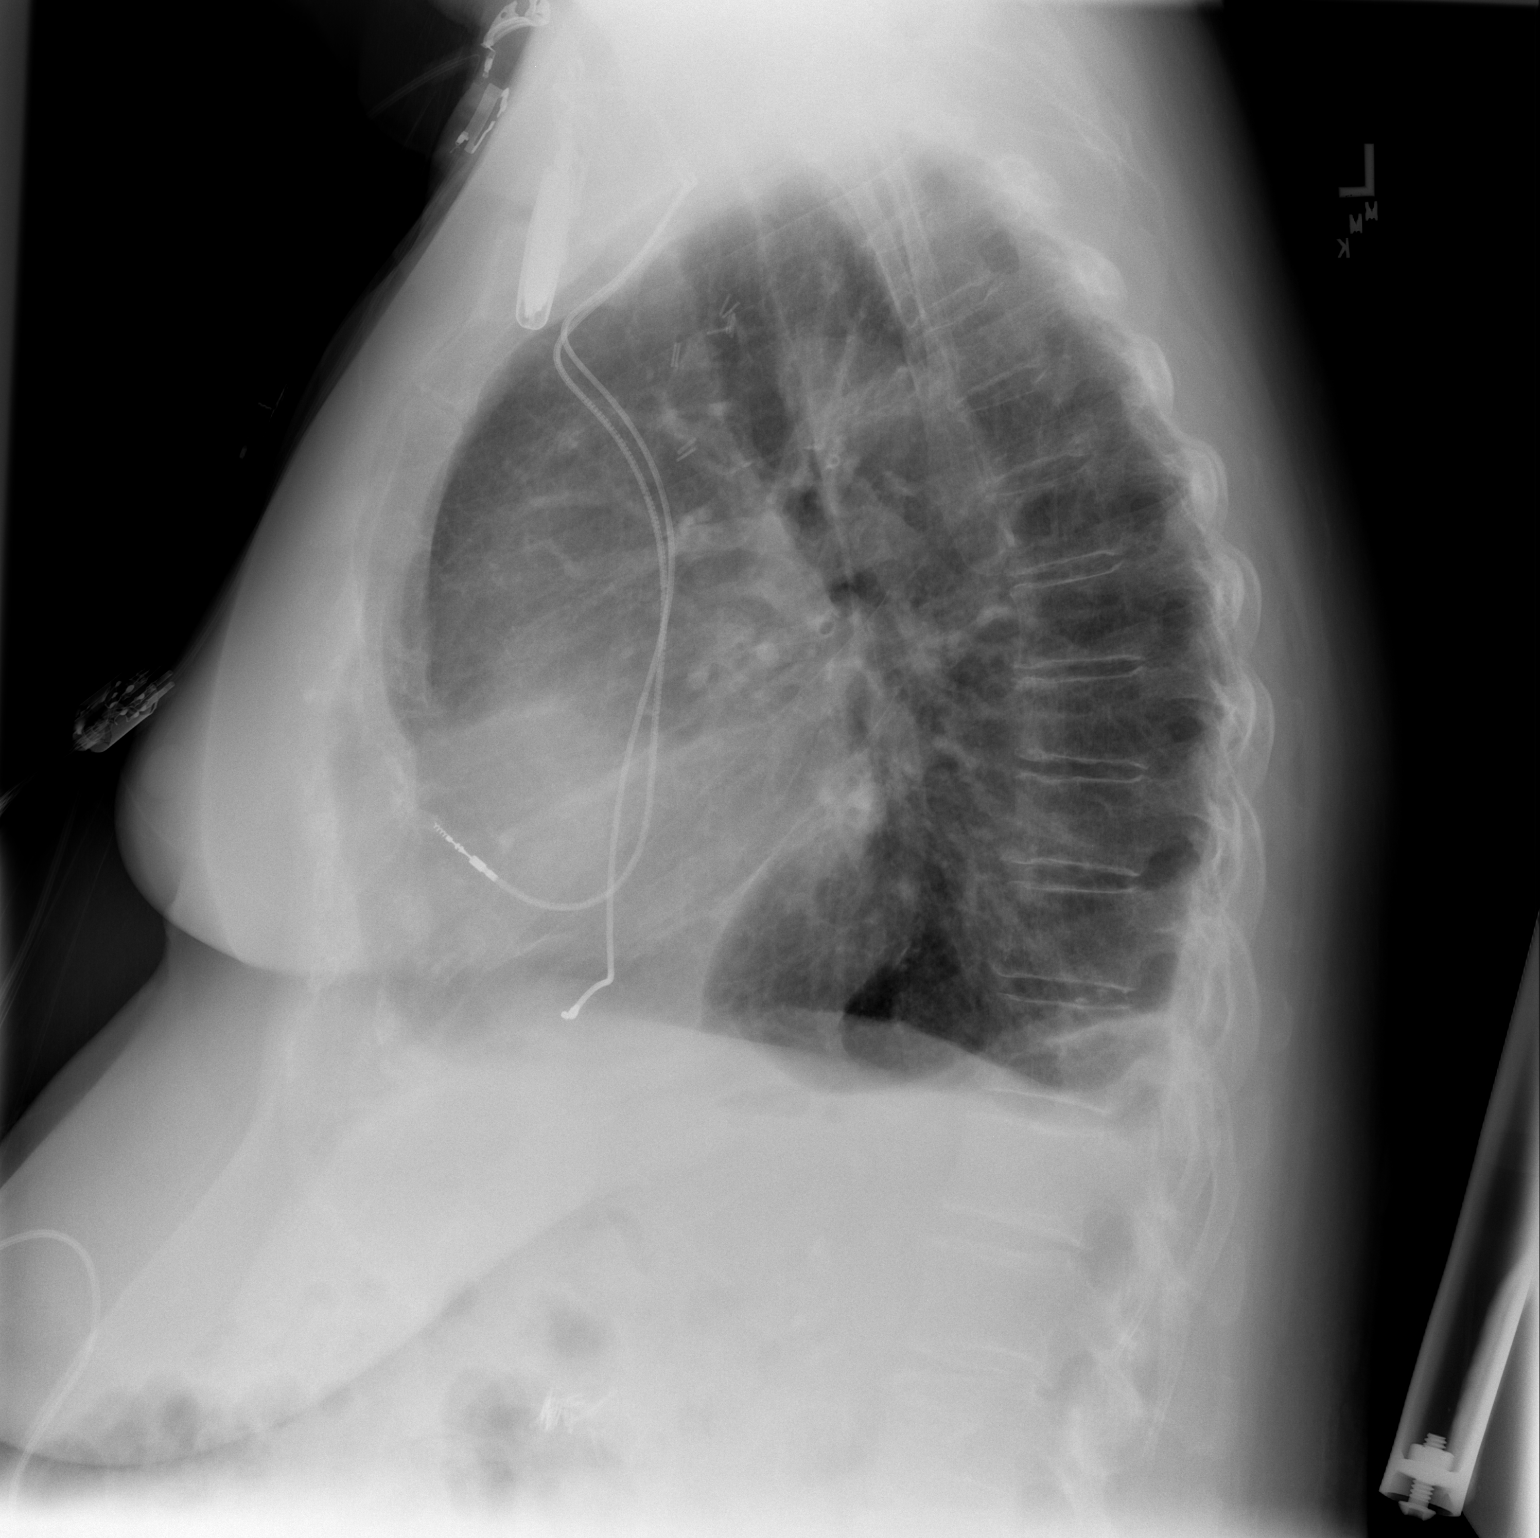

[2 of 2 positions shown; findings below may reference images not displayed]

FINDINGS: The lungs are adequately inflated. There is a small amount of
pleural fluid blunting the left lateral costophrenic angle, and
there is minimal left basilar density posterolaterally as well. The
heart is top-normal in size. The pulmonary vascularity is not
engorged. The permanent pacemaker is unchanged in position. The
observed portions of the bony thorax are unremarkable.
IMPRESSION: 1. There has been improvement in the appearance of the pulmonary
interstitium since the previous study, but there is persistent
density at the lung bases which may reflect subsegmental
atelectasis. A small amount of pleural fluid on the left is present.
2. There is no alveolar pneumonia. Minimal pulmonary interstitial
prominence may reflect low-grade compensated CHF.

## 2015-10-14 NOTE — Telephone Encounter (Signed)
Error
